# Patient Record
Sex: Male | Born: 1970 | Hispanic: Yes | Marital: Single | Smoking: Never smoker
Health system: Southern US, Community
[De-identification: ages and names within clinical notes are randomized; demographics above are authoritative.]

## PROBLEM LIST (undated history)

## (undated) DIAGNOSIS — N492 Inflammatory disorders of scrotum: Secondary | ICD-10-CM

## (undated) DIAGNOSIS — E119 Type 2 diabetes mellitus without complications: Secondary | ICD-10-CM

## (undated) DIAGNOSIS — I10 Essential (primary) hypertension: Secondary | ICD-10-CM

## (undated) HISTORY — PX: NO PAST SURGERIES: SHX2092

---

## 2018-01-27 ENCOUNTER — Emergency Department (HOSPITAL_COMMUNITY): Payer: 59

## 2018-01-27 ENCOUNTER — Inpatient Hospital Stay (HOSPITAL_COMMUNITY)
Admission: EM | Admit: 2018-01-27 | Discharge: 2018-01-30 | DRG: 872 | Disposition: A | Discharge status: Home Health | Payer: 59 | Attending: Family Medicine | Admitting: Family Medicine

## 2018-01-27 ENCOUNTER — Encounter (HOSPITAL_COMMUNITY): Payer: Self-pay | Admitting: Emergency Medicine

## 2018-01-27 DIAGNOSIS — A419 Sepsis, unspecified organism: Secondary | ICD-10-CM | POA: Diagnosis present

## 2018-01-27 DIAGNOSIS — N5089 Other specified disorders of the male genital organs: Secondary | ICD-10-CM | POA: Diagnosis not present

## 2018-01-27 DIAGNOSIS — R6520 Severe sepsis without septic shock: Secondary | ICD-10-CM | POA: Diagnosis present

## 2018-01-27 DIAGNOSIS — E119 Type 2 diabetes mellitus without complications: Secondary | ICD-10-CM

## 2018-01-27 DIAGNOSIS — I10 Essential (primary) hypertension: Secondary | ICD-10-CM | POA: Diagnosis present

## 2018-01-27 DIAGNOSIS — Z833 Family history of diabetes mellitus: Secondary | ICD-10-CM | POA: Diagnosis not present

## 2018-01-27 DIAGNOSIS — B372 Candidiasis of skin and nail: Secondary | ICD-10-CM | POA: Diagnosis present

## 2018-01-27 DIAGNOSIS — R52 Pain, unspecified: Secondary | ICD-10-CM

## 2018-01-27 DIAGNOSIS — R000 Tachycardia, unspecified: Secondary | ICD-10-CM | POA: Diagnosis not present

## 2018-01-27 DIAGNOSIS — N50811 Right testicular pain: Secondary | ICD-10-CM | POA: Diagnosis not present

## 2018-01-27 DIAGNOSIS — R81 Glycosuria: Secondary | ICD-10-CM | POA: Diagnosis not present

## 2018-01-27 DIAGNOSIS — N492 Inflammatory disorders of scrotum: Secondary | ICD-10-CM | POA: Diagnosis present

## 2018-01-27 DIAGNOSIS — Z8249 Family history of ischemic heart disease and other diseases of the circulatory system: Secondary | ICD-10-CM | POA: Diagnosis not present

## 2018-01-27 HISTORY — DX: Type 2 diabetes mellitus without complications: E11.9

## 2018-01-27 HISTORY — DX: Inflammatory disorders of scrotum: N49.2

## 2018-01-27 HISTORY — DX: Essential (primary) hypertension: I10

## 2018-01-27 LAB — BASIC METABOLIC PANEL
ANION GAP: 13 (ref 5–15)
Anion gap: 17 — ABNORMAL HIGH (ref 5–15)
BUN: 14 mg/dL (ref 6–20)
BUN: 13 mg/dL (ref 6–20)
CHLORIDE: 95 mmol/L — AB (ref 101–111)
CHLORIDE: 98 mmol/L — AB (ref 101–111)
CO2: 22 mmol/L (ref 22–32)
CO2: 23 mmol/L (ref 22–32)
CREATININE: 1.03 mg/dL (ref 0.61–1.24)
Calcium: 8.7 mg/dL — ABNORMAL LOW (ref 8.9–10.3)
Calcium: 7.9 mg/dL — ABNORMAL LOW (ref 8.9–10.3)
Creatinine, Ser: 1.06 mg/dL (ref 0.61–1.24)
GFR calc Af Amer: >60 mL/min (ref >60)
GFR calc Af Amer: >60 mL/min (ref >60)
GFR calc non Af Amer: >60 mL/min (ref >60)
Glucose, Bld: 463 mg/dL — ABNORMAL HIGH (ref 65–99)
Glucose, Bld: 361 mg/dL — ABNORMAL HIGH (ref 65–99)
POTASSIUM: 3.6 mmol/L (ref 3.5–5.1)
Potassium: 3.6 mmol/L (ref 3.5–5.1)
SODIUM: 134 mmol/L — AB (ref 135–145)
SODIUM: 134 mmol/L — AB (ref 135–145)

## 2018-01-27 LAB — CBC
HCT: 48.5 % (ref 39.0–52.0)
HEMOGLOBIN: 17.5 g/dL — AB (ref 13.0–17.0)
MCH: 31 pg (ref 26.0–34.0)
MCHC: 36.1 g/dL — AB (ref 30.0–36.0)
MCV: 86 fL (ref 78.0–100.0)
Platelets: 327 10*3/uL (ref 150–400)
RBC: 5.64 MIL/uL (ref 4.22–5.81)
RDW: 12.2 % (ref 11.5–15.5)
WBC: 17.3 10*3/uL — ABNORMAL HIGH (ref 4.0–10.5)

## 2018-01-27 LAB — HEMOGLOBIN A1C
HEMOGLOBIN A1C: 11 % — AB (ref 4.8–5.6)
Mean Plasma Glucose: 269 mg/dL

## 2018-01-27 LAB — I-STAT CG4 LACTIC ACID, ED
LACTIC ACID, VENOUS: 3.48 mmol/L — AB (ref 0.5–1.9)
Lactic Acid, Venous: 2.18 mmol/L (ref 0.5–1.9)

## 2018-01-27 LAB — MRSA PCR SCREENING: MRSA by PCR: NEGATIVE

## 2018-01-27 LAB — MAGNESIUM: Magnesium: 1.9 mg/dL (ref 1.7–2.4)

## 2018-01-27 LAB — LACTIC ACID, PLASMA: Lactic Acid, Venous: 1.9 mmol/L (ref 0.5–1.9)

## 2018-01-27 LAB — CBG MONITORING, ED: GLUCOSE-CAPILLARY: 346 mg/dL — AB (ref 65–99)

## 2018-01-27 LAB — PHOSPHORUS: Phosphorus: 4.2 mg/dL (ref 2.5–4.6)

## 2018-01-27 LAB — GLUCOSE, CAPILLARY
GLUCOSE-CAPILLARY: 370 mg/dL — AB (ref 65–99)
Glucose-Capillary: 321 mg/dL — ABNORMAL HIGH (ref 65–99)

## 2018-01-27 MED ORDER — SODIUM CHLORIDE 0.9 % IV SOLN
INTRAVENOUS | Status: DC
  Administered 2018-01-27 – 2018-01-30 (×5): via INTRAVENOUS

## 2018-01-27 MED ORDER — FENTANYL CITRATE (PF) 100 MCG/2ML IJ SOLN
50.0000 ug | Freq: Once | INTRAMUSCULAR | Status: AC
  Administered 2018-01-27: 50 ug via INTRAVENOUS
  Filled 2018-01-27: qty 2

## 2018-01-27 MED ORDER — ACETAMINOPHEN 325 MG PO TABS: 650.0000 mg | ORAL_TABLET | Freq: Four times a day (QID) | ORAL | Status: DC | PRN

## 2018-01-27 MED ORDER — LACTATED RINGERS IV BOLUS
1000.0000 mL | Freq: Once | INTRAVENOUS | Status: AC
  Administered 2018-01-27: 1000 mL via INTRAVENOUS

## 2018-01-27 MED ORDER — INSULIN ASPART 100 UNIT/ML ~~LOC~~ SOLN: 5.0000 [IU] | Freq: Once | SUBCUTANEOUS | Status: DC

## 2018-01-27 MED ORDER — LIDOCAINE HCL (PF) 1 % IJ SOLN
30.0000 mL | Freq: Once | INTRAMUSCULAR | Status: AC
  Administered 2018-01-27: 30 mL via INTRADERMAL
  Filled 2018-01-27: qty 30

## 2018-01-27 MED ORDER — PIPERACILLIN-TAZOBACTAM 3.375 G IVPB
3.3750 g | Freq: Three times a day (TID) | INTRAVENOUS | Status: DC
  Administered 2018-01-27 – 2018-01-30 (×9): 3.375 g via INTRAVENOUS
  Filled 2018-01-27 (×10): qty 50

## 2018-01-27 MED ORDER — OXYCODONE HCL 5 MG PO TABS: 5.0000 mg | ORAL_TABLET | ORAL | Status: DC | PRN

## 2018-01-27 MED ORDER — IOHEXOL 300 MG/ML  SOLN
75.0000 mL | Freq: Once | INTRAMUSCULAR | Status: AC
  Administered 2018-01-27: 75 mL via INTRAVENOUS

## 2018-01-27 MED ORDER — MORPHINE SULFATE (PF) 4 MG/ML IV SOLN: 1.0000 mg | INTRAVENOUS | Status: DC | PRN

## 2018-01-27 MED ORDER — VANCOMYCIN HCL 10 G IV SOLR
2000.0000 mg | Freq: Once | INTRAVENOUS | Status: AC
  Administered 2018-01-27: 2000 mg via INTRAVENOUS
  Filled 2018-01-27 (×2): qty 2000

## 2018-01-27 MED ORDER — ONDANSETRON HCL 4 MG/2ML IJ SOLN: 4.0000 mg | Freq: Four times a day (QID) | INTRAMUSCULAR | Status: DC | PRN

## 2018-01-27 MED ORDER — VANCOMYCIN HCL IN DEXTROSE 1-5 GM/200ML-% IV SOLN: 1000.0000 mg | Freq: Three times a day (TID) | INTRAVENOUS | Status: DC

## 2018-01-27 MED ORDER — INSULIN ASPART 100 UNIT/ML ~~LOC~~ SOLN
0.0000 [IU] | SUBCUTANEOUS | Status: DC
  Administered 2018-01-27 (×2): 11 [IU] via SUBCUTANEOUS
  Administered 2018-01-28 (×4): 8 [IU] via SUBCUTANEOUS
  Administered 2018-01-28: 11 [IU] via SUBCUTANEOUS
  Administered 2018-01-29: 5 [IU] via SUBCUTANEOUS
  Administered 2018-01-29: 3 [IU] via SUBCUTANEOUS
  Administered 2018-01-29: 5 [IU] via SUBCUTANEOUS
  Administered 2018-01-29 (×2): 3 [IU] via SUBCUTANEOUS
  Administered 2018-01-29: 5 [IU] via SUBCUTANEOUS
  Administered 2018-01-29: 3 [IU] via SUBCUTANEOUS
  Administered 2018-01-30: 5 [IU] via SUBCUTANEOUS
  Administered 2018-01-30 (×2): 2 [IU] via SUBCUTANEOUS
  Administered 2018-01-30: 8 [IU] via SUBCUTANEOUS
  Filled 2018-01-27: qty 1

## 2018-01-27 MED ORDER — VANCOMYCIN HCL IN DEXTROSE 1-5 GM/200ML-% IV SOLN
1000.0000 mg | Freq: Three times a day (TID) | INTRAVENOUS | Status: DC
  Administered 2018-01-27 – 2018-01-30 (×9): 1000 mg via INTRAVENOUS
  Filled 2018-01-27 (×10): qty 200

## 2018-01-27 MED ORDER — SODIUM CHLORIDE 0.9% FLUSH
3.0000 mL | Freq: Two times a day (BID) | INTRAVENOUS | Status: DC
  Administered 2018-01-27 – 2018-01-29 (×3): 3 mL via INTRAVENOUS

## 2018-01-27 MED ORDER — PIPERACILLIN-TAZOBACTAM 3.375 G IVPB 30 MIN
3.3750 g | Freq: Once | INTRAVENOUS | Status: AC
  Administered 2018-01-27: 3.375 g via INTRAVENOUS
  Filled 2018-01-27: qty 50

## 2018-01-27 MED ORDER — ONDANSETRON HCL 4 MG PO TABS: 4.0000 mg | ORAL_TABLET | Freq: Four times a day (QID) | ORAL | Status: DC | PRN

## 2018-01-27 MED ORDER — ACETAMINOPHEN 650 MG RE SUPP: 650.0000 mg | Freq: Four times a day (QID) | RECTAL | Status: DC | PRN

## 2018-01-27 MED ORDER — FLUCONAZOLE 100 MG PO TABS
200.0000 mg | ORAL_TABLET | Freq: Every day | ORAL | Status: DC
  Administered 2018-01-27 – 2018-01-30 (×4): 200 mg via ORAL
  Filled 2018-01-27 (×4): qty 2

## 2018-01-27 NOTE — ED Triage Notes (Signed)
Pt reports testicle swelling and pain for the past 2 days, denies any injury, no discharge, no difficulty urinating

## 2018-01-27 NOTE — ED Notes (Signed)
Admitting at bedside 

## 2018-01-27 NOTE — ED Notes (Signed)
Patient transported to CT 

## 2018-01-27 NOTE — ED Notes (Addendum)
CBG 346  

## 2018-01-27 NOTE — Progress Notes (Signed)
Received report from [place], Therapist, sports in Ed

## 2018-01-27 NOTE — Progress Notes (Signed)
Pharmacy Antibiotic Note  Erik Rojas is a 47 y.o. male admitted on 01/27/2018 with possible Fournier's gangrene.  Pharmacy has been consulted for vancomycin dosing. Zosyn has been ordered per EDP.  Plan: Zosyn 3.375 IV x1 over 30 min then q8h EI per MD Vancomycin 2000mg  IV x1 then 1000mg  IV q8h Monitor surgical plans, LOT, cultures Vancomycin level as needed     Temp (24hrs), Avg:99.6 F (37.6 C), Min:99.6 F (37.6 C), Max:99.6 F (37.6 C)  Recent Labs  Lab 01/27/18 1029  WBC 17.3*  CREATININE 1.03    CrCl cannot be calculated (Unknown ideal weight.).    No Known Allergies  Antimicrobials this admission: 6/13 Vancomycin >>  6/13 Zosyn >>   Dose adjustments this admission: none  Microbiology results: ordered  Thank you for allowing pharmacy to be a part of this patient's care.  Arrie Senate, PharmD, BCPS PGY-2 Cardiology Pharmacy Resident Phone: [phone] 01/27/2018

## 2018-01-27 NOTE — ED Notes (Signed)
Urology arrives at bedside

## 2018-01-27 NOTE — ED Provider Notes (Signed)
[facility] EMERGENCY DEPARTMENT Provider Note   CSN: [id number] Arrival date & time: 01/27/18  1001     History   Chief Complaint Chief Complaint  Patient presents with  . Testicle Pain    HPI Kadien Lineman is a 47 y.o. male.  The history is provided by the patient. No language interpreter was used.  Testicle Pain    Dondrell Loudermilk is a 47 y.o. male who presents to the Emergency Department complaining of testicle pain. Two days of right testicular swelling  followed by pain this morning. He denies any fevers, nausea, vomiting, difficulty urinating. He has a history of hypertension, no additional medical problems. He does have a family history of diabetes. He is a non-smoker, no alcohol or drug use. He is not currently sexually active. Symptoms are severe, constant, worsening. History reviewed. No pertinent past medical history.  There are no active problems to display for this patient.   History reviewed. No pertinent surgical history.      Home Medications    Prior to Admission medications   Not on File    Family History No family history on file.  Social History Social History   Tobacco Use  . Smoking status: Not on file  Substance Use Topics  . Alcohol use: Not on file  . Drug use: Not on file     Allergies   Patient has no known allergies.   Review of Systems Review of Systems  Genitourinary: Positive for testicular pain.  All other systems reviewed and are negative.    Physical Exam Updated Vital Signs BP (!) 141/88   Pulse (!) 114   Temp 99.6 F (37.6 C) (Oral)   Resp (!) 25   Wt 99.8 kg (220 lb)   SpO2 93%   Physical Exam  Constitutional: He is oriented to person, place, and time. He appears well-developed and well-nourished.  HENT:  Head: Normocephalic and atraumatic.  Cardiovascular: Regular rhythm.  No murmur heard. tachycardic  Pulmonary/Chest: Effort normal and breath sounds normal.  No respiratory distress.  Abdominal: Soft. There is no tenderness. There is no rebound and no guarding.  Genitourinary:  Genitourinary Comments: Severe scrotal erythema and edema bilaterally, unable to palpate testicles, right greater than left. There is a central area of ecchymosis, approx 2-3 cm in diameter.  Minimal local tenderness.  Musculoskeletal: He exhibits no edema or tenderness.  Neurological: He is alert and oriented to person, place, and time.  Skin: Skin is warm and dry.  Psychiatric: He has a normal mood and affect. His behavior is normal.  Nursing note and vitals reviewed.    ED Treatments / Results  Labs (all labs ordered are listed, but only abnormal results are displayed) Labs Reviewed  CBC - Abnormal; Notable for the following components:      Result Value   WBC 17.3 (*)    Hemoglobin 17.5 (*)    MCHC 36.1 (*)    All other components within normal limits  BASIC METABOLIC PANEL - Abnormal; Notable for the following components:   Sodium 134 (*)    Chloride 95 (*)    Glucose, Bld 463 (*)    Calcium 8.7 (*)    Anion gap 17 (*)    All other components within normal limits  I-STAT CG4 LACTIC ACID, ED - Abnormal; Notable for the following components:   Lactic Acid, Venous 3.48 (*)    All other components within normal limits  I-STAT CG4 LACTIC ACID, ED - Abnormal;  Notable for the following components:   Lactic Acid, Venous 2.18 (*)    All other components within normal limits  URINE CULTURE  CULTURE, BLOOD (ROUTINE X 2)  CULTURE, BLOOD (ROUTINE X 2)  URINALYSIS, ROUTINE W REFLEX MICROSCOPIC    EKG None  Radiology Ct Pelvis W Contrast  Result Date: 01/27/2018 CLINICAL DATA:  Testicular swelling and pain for 2 days. EXAM: CT PELVIS WITH CONTRAST TECHNIQUE: Multidetector CT imaging of the pelvis was performed using the standard protocol following the bolus administration of intravenous contrast. CONTRAST:  71mL OMNIPAQUE IOHEXOL 300 MG/ML  SOLN COMPARISON:   None. FINDINGS: Urinary Tract:  No abnormality visualized. Bowel:  Unremarkable visualized pelvic bowel loops. Vascular/Lymphatic: No pathologically enlarged lymph nodes. No significant vascular abnormality seen. Reproductive: Prostate gland is unremarkable. Extensive soft tissue emphysema involving the right scrotum, surrounding the testicle and extending into the spermatic cord, most concerning for Fournier's gangrene. Other:  None. Musculoskeletal: No acute osseous abnormality. No aggressive osseous lesion. IMPRESSION: 1. Extensive soft tissue emphysema involving the right scrotum, surrounding the testicle and extending into the spermatic cord, most concerning for Fournier's gangrene. Critical Value/emergent results were called by telephone at the time of interpretation on 01/27/2018 at 1:07 pm to [facility] , who verbally acknowledged these results. Electronically Signed   By: Kathreen Devoid   On: 01/27/2018 13:07   US Scrotum W/doppler  Result Date: 01/27/2018 CLINICAL DATA:  Right greater than left scrotal swelling, pain EXAM: SCROTAL ULTRASOUND DOPPLER ULTRASOUND OF THE TESTICLES TECHNIQUE: Complete ultrasound examination of the testicles, epididymis, and other scrotal structures was performed. Color and spectral Doppler ultrasound were also utilized to evaluate blood flow to the testicles. COMPARISON:  None. FINDINGS: Right testicle Measurements: 4.5 x 2.3 x 3.6 cm. No mass or microlithiasis visualized. Left testicle Measurements: 4.9 x 1.9 x 3.2 cm. No mass or microlithiasis visualized. Right epididymis:  5 mm epididymal head cyst. Left epididymis:  Normal in size and appearance. Hydrocele:  None visualized. Varicocele:  None visualized. Pulsed Doppler interrogation of both testes demonstrates normal low resistance arterial and venous waveforms bilaterally. Other: Extensive echogenic foci are noted throughout the right scrotum with posterior acoustic shadowing. This is concerning for possible scrotal  soft tissue gas. Cannot exclude Fournier's gangrene. Recommend further evaluation with he pelvic CT. IMPRESSION: Echogenic foci with shadowing noted throughout the right scrotal soft tissues concerning for gas. Cannot exclude Fournier's gangrene. Recommend further evaluation with pelvic CT. No testicular abnormality. These results were called by telephone at the time of interpretation on 01/27/2018 at 11:27 am to [facility] , who verbally acknowledged these results. Electronically Signed   By: Rolm Baptise M.D.   On: 01/27/2018 11:27    Procedures Procedures (including critical care time) CRITICAL CARE Performed by: Quintella Reichert   Total critical care time: 45 minutes  Critical care time was exclusive of separately billable procedures and treating other patients.  Critical care was necessary to treat or prevent imminent or life-threatening deterioration.  Critical care was time spent personally by me on the following activities: development of treatment plan with patient and/or surrogate as well as nursing, discussions with consultants, evaluation of patient's response to treatment, examination of patient, obtaining history from patient or surrogate, ordering and performing treatments and interventions, ordering and review of laboratory studies, ordering and review of radiographic studies, pulse oximetry and re-evaluation of patient's condition.  Medications Ordered in ED Medications  vancomycin (VANCOCIN) 2,000 mg in sodium chloride 0.9 % 500 mL IVPB (2,000 mg  Intravenous New Bag/Given 01/27/18 1536)  piperacillin-tazobactam (ZOSYN) IVPB 3.375 g (has no administration in time range)  vancomycin (VANCOCIN) IVPB 1000 mg/200 mL premix (has no administration in time range)  fentaNYL (SUBLIMAZE) injection 50 mcg (has no administration in time range)  lactated ringers bolus 1,000 mL (0 mLs Intravenous Stopped 01/27/18 1316)  piperacillin-tazobactam (ZOSYN) IVPB 3.375 g (0 g Intravenous Stopped  01/27/18 1252)  iohexol (OMNIPAQUE) 300 MG/ML solution 75 mL (75 mLs Intravenous Contrast Given 01/27/18 1229)  lactated ringers bolus 1,000 mL (0 mLs Intravenous Stopped 01/27/18 1432)  lidocaine (PF) (XYLOCAINE) 1 % injection 30 mL (30 mLs Intradermal Given 01/27/18 1536)     Initial Impression / Assessment and Plan / ED Course  I have reviewed the triage vital signs and the nursing notes.  Pertinent labs & imaging results that were available during my care of the patient were reviewed by me and considered in my medical decision making (see chart for details).     Here for evaluation of groin swelling and pain. He does have significant swelling on examination with mild local tenderness. Scrotal ultrasound with gas concerning for possible necrotizing soft tissue infection. Discussed the case with Urology, Dr. Alyson Ingles. CT pelvis obtained to further evaluate. CT also demonstrates subcutaneous emphysema. Urologist evaluated the patient at bedside and performed I and D.  Medicine consulted for admission for severe sepsis, antibiotics.    Final Clinical Impressions(s) / ED Diagnoses   Final diagnoses:  Scrotal abscess  Severe sepsis [facility])    ED Discharge Orders    None       Quintella Reichert, MD 01/27/18 1614

## 2018-01-27 NOTE — ED Notes (Signed)
Date and time results received: 01/27/18 1238 (use smartphrase ".now" to insert current time)  Test: Lactic acid Critical Value: 3.48  Name of Provider Notified: Dr. Ralene Bathe  Orders Received? Or Actions Taken?:EDP to place new orders

## 2018-01-27 NOTE — ED Notes (Signed)
Got error with Recruitment consultant.  Will use patient labels for blood drawn

## 2018-01-27 NOTE — ED Notes (Signed)
Pt made aware of need for urine sample.  States does not want to try at this time due to recent procedure on his testicle.

## 2018-01-27 NOTE — Progress Notes (Signed)
Patient CBG 370 at 2023 .  Patient was given 11 units in the ED an hour ago for CBG of 346. Patient has orders to check CBG Q4H.  Notified NP K. Schorr on call. Will continue to monitor the patient.

## 2018-01-27 NOTE — ED Notes (Signed)
PT last had something to drink at noon.   Last ate yesterday at 1900.

## 2018-01-27 NOTE — Consult Note (Signed)
Urology Consult  Referring physician: Dr. Ralene Bathe Reason for referral: scrotal abscess  Chief Complaint: scrotal pain  History of Present Illness: Mr Erik Rojas is a 47yo with a hx of hypertension and new diagnosis of DMII who presented to [facility] ER with a 3 day history of progressive scrotal swelling and a 2 day history of scrotal pain. No previous history of abscesses. The pain is sharp, constant, severe, and nonradiating. It is worse in his right hemiscrotum. No exacerbating/alleviaitng events. No other associated symptoms. CT shows a scrotal abscess with gas but no concern for fourniers gangrene  Past Medical History:  Diagnosis Date  . HTN (hypertension)    History reviewed. No pertinent surgical history.  Medications: I have reviewed the patient's current medications. Allergies: No Known Allergies  Family History  Problem Relation Age of Onset  . Diabetes Mellitus II Mother   . Hypertension Mother   . Diabetes Mellitus II Brother    Social History:  has no tobacco, alcohol, and drug history on file.  Review of Systems  Constitutional: Positive for chills.  All other systems reviewed and are negative.   Physical Exam:  Vital signs in last 24 hours: Temp:  [99.6 F (37.6 C)] 99.6 F (37.6 C) (06/13 1013) Pulse Rate:  [111-140] 113 (06/13 1700) Resp:  [10-29] 27 (06/13 1700) BP: (128-151)/(82-106) 128/82 (06/13 1700) SpO2:  [92 %-98 %] 93 % (06/13 1700) Weight:  [99.8 kg (220 lb)] 99.8 kg (220 lb) (06/13 1200) Physical Exam  Constitutional: He is oriented to person, place, and time. He appears well-developed and well-nourished.  HENT:  Head: Normocephalic and atraumatic.  Eyes: Pupils are equal, round, and reactive to light. EOM are normal.  Neck: Normal range of motion. No thyromegaly present.  Cardiovascular: Normal rate and regular rhythm.  Respiratory: Effort normal. No respiratory distress.  GI: Soft. He exhibits no distension. There is no tenderness. Hernia  confirmed negative in the right inguinal area and confirmed negative in the left inguinal area.  Genitourinary: Penis normal. Right testis shows swelling. Left testis shows swelling. Uncircumcised.  Genitourinary Comments: right lower scrotal wall edema and erythema  Musculoskeletal: Normal range of motion. He exhibits no edema.  Lymphadenopathy:       Right: No inguinal adenopathy present.       Left: No inguinal adenopathy present.  Neurological: He is alert and oriented to person, place, and time.  Skin: Skin is warm and dry.  Psychiatric: He has a normal mood and affect. His behavior is normal. Judgment and thought content normal.    Laboratory Data:  Results for orders placed or performed during the hospital encounter of 01/27/18 (from the past 72 hour(s))  CBC     Status: Abnormal   Collection Time: 01/27/18 10:29 AM  Result Value Ref Range   WBC 17.3 (H) 4.0 - 10.5 K/uL   RBC 5.64 4.22 - 5.81 MIL/uL   Hemoglobin 17.5 (H) 13.0 - 17.0 g/dL   HCT 48.5 39.0 - 52.0 %   MCV 86.0 78.0 - 100.0 fL   MCH 31.0 26.0 - 34.0 pg   MCHC 36.1 (H) 30.0 - 36.0 g/dL   RDW 12.2 11.5 - 15.5 %   Platelets 327 150 - 400 K/uL    Comment: Performed at [facility] [address] [place], [place] [address]  Basic metabolic panel     Status: Abnormal   Collection Time: 01/27/18 10:29 AM  Result Value Ref Range   Sodium 134 (L) 135 -  145 mmol/L   Potassium 3.6 3.5 - 5.1 mmol/L   Chloride 95 (L) 101 - 111 mmol/L   CO2 22 22 - 32 mmol/L   Glucose, Bld 463 (H) 65 - 99 mg/dL   BUN 14 6 - 20 mg/dL   Creatinine, Ser 1.03 0.61 - 1.24 mg/dL   Calcium 8.7 (L) 8.9 - 10.3 mg/dL   GFR calc non Af Amer >60 >60 mL/min   GFR calc Af Amer >60 >60 mL/min    Comment: (NOTE) The eGFR has been calculated using the CKD EPI equation. This calculation has not been validated in all clinical situations. eGFR's persistently <60 mL/min signify possible Chronic Kidney Disease.    Anion gap 17 (H) 5 - 15     Comment: Performed at [facility], [place] [address], [place], [place] [address]  I-Stat CG4 Lactic Acid, ED  (not at  [facility])     Status: Abnormal   Collection Time: 01/27/18 12:33 PM  Result Value Ref Range   Lactic Acid, Venous 3.48 (HH) 0.5 - 1.9 mmol/L   Comment NOTIFIED PHYSICIAN   I-Stat CG4 Lactic Acid, ED  (not at  [facility])     Status: Abnormal   Collection Time: 01/27/18  2:55 PM  Result Value Ref Range   Lactic Acid, Venous 2.18 (HH) 0.5 - 1.9 mmol/L   Comment NOTIFIED PHYSICIAN    No results found for this or any previous visit (from the past 240 hour(s)). Creatinine: Recent Labs    01/27/18 1029  CREATININE 1.03   Baseline Creatinine: 1  Preprocedure diagnosis: scrotal abscess  Postprocedure diagnosis: Same  Procedure: 1. Incision and drainage of scrotal abscess  Attending: Nicolette Bang, MD  Anesthesia: local  History of blood loss: Minimal  Drains: none  Findings: large right hemiscrotum abscess  Indications: Patient is a 47 year old male with a history of right scrotal abscess that was growing in size and causing him pain with walking.  We discussed the treatment options including observation versus excision after discussing treatment options he proceed with drainage.   Procedure in detail: Prior to procedure verbal consent was obtained.  His genitalia was then prepped and draped in usual sterile fashion.  A 3 cm incision was made in the right hemiscrotum.  We then proceeded to drain  The abscess and multiple pockets of abscess were opened. We then packed the right hemiscrotum with iodiform gauze. The patient tolerated the procedure well.  Complications: None  Condition: Stable  Impression/Assessment:  46yo with a scrotal abscess  Plan:  1. Scrotal abscess drained at the bedside and packed with iodiform gauze. Please continue daily packing changes. Please treat with 7 days of either bactrim or clindamycin. The [facility] 7-10 days after discharge for a wound check.  Nicolette Bang 01/27/2018, 5:43 PM

## 2018-01-27 NOTE — H&P (Signed)
History and Physical    Erik Rojas RWE:[id number] DOB: 07/27/1971 DOA: 01/27/2018  **Will admit patient based on the expectation that the patient will need hospitalization/ hospital care that crosses at least 2 midnights  PCP: Patient, No Pcp Per   Attending physician: Evangeline Gula  Patient coming from/Resides with: Private residence  Chief Complaint: Scrotal pain and swelling  HPI: Erik Rojas is a 47 y.o. male with medical history significant for untreated hypertension.  Patient reports 2 days of right testicular swelling followed by pain.  Denied knowledge of recent trauma or known colitis.  Does not shave this region of his body.  Has never had testicular swelling before.  He is not sexually active.  She had a low-grade fever 99.6.  Initial heart rate was 140 bpm and BP 151/106.  He was not hypoxemic.  Count was 17,300 differential not obtained.  Initial lactic acid 3.48 with decreased down to 2.18 midway through volume resuscitation.  Blood cultures were obtained in ER.  Scrotal ultrasound demonstrated echogenic foci shadowing throughout the right scrotal soft tissues concerning for gas.  Pelvic CT was obtained and revealed extensive soft tissue emphysema involving the right scrotum surrounding the testicle extending into the spermatic cord concerning for Fournier's gangrene.  Because of these findings EDP notified Urology/Dr. Alyson Ingles came to the bedside, examined the patient and reviewed the CT.  Urology felt current clinical exam more consistent with abscess and bedside I&D was done.  Patient has been given empiric Zosyn and vancomycin IV.  In addition to the above findings regarding the scrotal infection patient was found to have a glucose of 463 with a mildly elevated anion gap of 17 consistent with likely new onset diabetes mellitus.  ED Course:  Vital Signs: BP (!) 143/90   Pulse (!) 113   Temp 99.6 F (37.6 C) (Oral)   Resp 16   Wt 99.8 kg (220 lb)   SpO2  94%  Scrotal ultrasound/CT pelvis: As above Lab data: Sodium 134, potassium 3.6, chloride 95, CO2 22, glucose 463, BUN 14, creatinine 1.03, calcium 8.7, anion gap 17, lactic acid 3.48 and follow-up 2.18, white count 17,300 differential not obtained, hemoglobin 17.5, platelets 327,000, blood cultures obtained in the ER; urinalysis and urine culture pending collection Medications and treatments: LR bolus x2 L, Zosyn 3.375 g IV x1, vancomycin 2 g IV x1, fentanyl 50 mcg IV x1  Review of Systems:  In addition to the HPI above,  No Fever-chills, myalgias or other constitutional symptoms No Headache, changes with Vision or hearing, new weakness, tingling, numbness in any extremity, dizziness, dysarthria or word finding difficulty, gait disturbance or imbalance, tremors or seizure activity No problems swallowing food or Liquids, indigestion/reflux, choking or coughing while eating, abdominal pain with or after eating No Chest pain, Cough or Shortness of Breath, palpitations, orthopnea or DOE No Abdominal pain, N/V, melena,hematochezia, dark tarry stools, constipation No dysuria, malodorous urine, hematuria or flank pain No new skin rashes, lesions, masses or bruises,-right scrotal swelling as described above No new joint pains, aches, swelling or redness No recent unintentional weight gain or loss No polyuria, polydypsia or polyphagia   Past Medical History:  Diagnosis Date  . HTN (hypertension)     History reviewed. No pertinent surgical history.  Social History   Socioeconomic History  . Marital status: Single    Spouse name: Not on file  . Number of children: Not on file  . Years of education: Not on file  . Highest education level: Not on  file  Occupational History  . Not on file  Social Needs  . Financial resource strain: Not on file  . Food insecurity:    Worry: Not on file    Inability: Not on file  . Transportation needs:    Medical: Not on file    Non-medical: Not on file    Tobacco Use  . Smoking status: Not on file  Substance and Sexual Activity  . Alcohol use: Not on file  . Drug use: Not on file  . Sexual activity: Not on file  Lifestyle  . Physical activity:    Days per week: Not on file    Minutes per session: Not on file  . Stress: Not on file  Relationships  . Social connections:    Talks on phone: Not on file    Gets together: Not on file    Attends religious service: Not on file    Active member of club or organization: Not on file    Attends meetings of clubs or organizations: Not on file    Relationship status: Not on file  . Intimate partner violence:    Fear of current or ex partner: Not on file    Emotionally abused: Not on file    Physically abused: Not on file    Forced sexual activity: Not on file  Other Topics Concern  . Not on file  Social History Narrative  . 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 Diabetes Mellitus II Mother   . Hypertension Mother   . Diabetes Mellitus II Brother      Prior to Admission medications   Not on File    Physical Exam: Vitals:   01/27/18 1330 01/27/18 1445 01/27/18 1530 01/27/18 1545  BP: (!) 134/92 (!) 130/95 (!) 141/88 (!) 143/90  Pulse: (!) 117 (!) 111 (!) 114 (!) 113  Resp: (!) 21 (!) 24 (!) 25 16  Temp:      TempSrc:      SpO2: 98% 93% 93% 94%  Weight:          Constitutional: NAD, calm, comfortable Eyes: PERRL, lids and conjunctivae normal ENMT: Mucous membranes are dry. Posterior pharynx clear of any exudate or lesions.Normal dentition.  Neck: normal, supple, no masses, no thyromegaly Respiratory: clear to auscultation bilaterally, no wheezing, no crackles. Normal respiratory effort. No accessory muscle use.  Cardiovascular: Regular rate and  rhythm, no murmurs / rubs / gallops. No extremity edema. 2+ pedal pulses. No carotid bruits.  Abdomen: no tenderness, no masses palpated. No hepatosplenomegaly. Bowel sounds positive.  Genitourinary: Has marked scrotal edema greater on the right with erythematous change.  Has 1.5 cm linear incision status post bedside I&D by urology that is oozing frank red blood.  Constrict in place.  Medial thighs around scrotum with changes consistent with Candida and odor detected consistent with yeast. Musculoskeletal: no clubbing / cyanosis. No joint deformity upper and lower extremities. Good ROM, no contractures. Normal muscle tone.  Skin: no rashes, lesions, ulcers. No induration Neurologic: CN 2-12 grossly intact. Sensation intact, DTR normal. Strength 5/5 x all 4 extremities.  Psychiatric: Normal judgment and insight. Alert and oriented x 3. Normal mood.    Labs on Admission: I have personally reviewed following labs and imaging  studies  CBC: Recent Labs  Lab 01/27/18 1029  WBC 17.3*  HGB 17.5*  HCT 48.5  MCV 86.0  PLT 701   Basic Metabolic Panel: Recent Labs  Lab 01/27/18 1029  NA 134*  K 3.6  CL 95*  CO2 22  GLUCOSE 463*  BUN 14  CREATININE 1.03  CALCIUM 8.7*   GFR: CrCl cannot be calculated (Unknown ideal weight.). Liver Function Tests: No results for input(s): AST, ALT, ALKPHOS, BILITOT, PROT, ALBUMIN in the last 168 hours. No results for input(s): LIPASE, AMYLASE in the last 168 hours. No results for input(s): AMMONIA in the last 168 hours. Coagulation Profile: No results for input(s): INR, PROTIME in the last 168 hours. Cardiac Enzymes: No results for input(s): CKTOTAL, CKMB, CKMBINDEX, TROPONINI in the last 168 hours. BNP (last 3 results) No results for input(s): PROBNP in the last 8760 hours. HbA1C: No results for input(s): HGBA1C in the last 72 hours. CBG: No results for input(s): GLUCAP in the last 168 hours. Lipid Profile: No results for input(s): CHOL, HDL,  LDLCALC, TRIG, CHOLHDL, LDLDIRECT in the last 72 hours. Thyroid Function Tests: No results for input(s): TSH, T4TOTAL, FREET4, T3FREE, THYROIDAB in the last 72 hours. Anemia Panel: No results for input(s): VITAMINB12, FOLATE, FERRITIN, TIBC, IRON, RETICCTPCT in the last 72 hours. Urine analysis: No results found for: COLORURINE, APPEARANCEUR, LABSPEC, PHURINE, GLUCOSEU, HGBUR, BILIRUBINUR, KETONESUR, PROTEINUR, UROBILINOGEN, NITRITE, LEUKOCYTESUR Sepsis Labs: @LABRCNTIP (procalcitonin:4,lacticidven:4) )No results found for this or any previous visit (from the past 240 hour(s)).   Radiological Exams on Admission: Ct Pelvis W Contrast  Result Date: 01/27/2018 CLINICAL DATA:  Testicular swelling and pain for 2 days. EXAM: CT PELVIS WITH CONTRAST TECHNIQUE: Multidetector CT imaging of the pelvis was performed using the standard protocol following the bolus administration of intravenous contrast. CONTRAST:  61mL OMNIPAQUE IOHEXOL 300 MG/ML  SOLN COMPARISON:  None. FINDINGS: Urinary Tract:  No abnormality visualized. Bowel:  Unremarkable visualized pelvic bowel loops. Vascular/Lymphatic: No pathologically enlarged lymph nodes. No significant vascular abnormality seen. Reproductive: Prostate gland is unremarkable. Extensive soft tissue emphysema involving the right scrotum, surrounding the testicle and extending into the spermatic cord, most concerning for Fournier's gangrene. Other:  None. Musculoskeletal: No acute osseous abnormality. No aggressive osseous lesion. IMPRESSION: 1. Extensive soft tissue emphysema involving the right scrotum, surrounding the testicle and extending into the spermatic cord, most concerning for Fournier's gangrene. Critical Value/emergent results were called by telephone at the time of interpretation on 01/27/2018 at 1:07 pm to [facility] , who verbally acknowledged these results. Electronically Signed   By: Kathreen Devoid   On: 01/27/2018 13:07   US Scrotum W/doppler  Result  Date: 01/27/2018 CLINICAL DATA:  Right greater than left scrotal swelling, pain EXAM: SCROTAL ULTRASOUND DOPPLER ULTRASOUND OF THE TESTICLES TECHNIQUE: Complete ultrasound examination of the testicles, epididymis, and other scrotal structures was performed. Color and spectral Doppler ultrasound were also utilized to evaluate blood flow to the testicles. COMPARISON:  None. FINDINGS: Right testicle Measurements: 4.5 x 2.3 x 3.6 cm. No mass or microlithiasis visualized. Left testicle Measurements: 4.9 x 1.9 x 3.2 cm. No mass or microlithiasis visualized. Right epididymis:  5 mm epididymal head cyst. Left epididymis:  Normal in size and appearance. Hydrocele:  None visualized. Varicocele:  None visualized. Pulsed Doppler interrogation of both testes demonstrates normal low resistance arterial and venous waveforms bilaterally. Other: Extensive echogenic foci are noted throughout the right scrotum with posterior acoustic shadowing. This is concerning for possible scrotal soft tissue gas. Cannot exclude  Fournier's gangrene. Recommend further evaluation with he pelvic CT. IMPRESSION: Echogenic foci with shadowing noted throughout the right scrotal soft tissues concerning for gas. Cannot exclude Fournier's gangrene. Recommend further evaluation with pelvic CT. No testicular abnormality. These results were called by telephone at the time of interpretation on 01/27/2018 at 11:27 am to [facility] , who verbally acknowledged these results. Electronically Signed   By: Rolm Baptise M.D.   On: 01/27/2018 11:27    EKG: (Independently reviewed) sinus tachycardia with ventricular rate 126 bpm, QTC 465 ms, normal R wave rotation, no acute ischemic changes, elevated J-point in the inferior leads consistent with early repolarization  Assessment/Plan Principal Problem:   Sepsis 2/2 Scrotal abscess/scrotal and medial thigh Candida dermatitis -Presents with 2 days of right scrotal swelling with 24 hours of pain and upon  presentation to ER and was found to be mildly febrile with leukocytosis and diagnostic imaging concerning for subcutaneous emphysema/Fournier's green.  He has been evaluated by urology who felt patient had underlying scrotal abscess and perform bedside I&D -Sepsis physiology as follows: Tachycardia, white count greater than 12,000, source of infection, elevated serum lactate which is currently trending downward -Continue gentle volume resuscitation noting patient is normotensive and borderline hypertensive -Repeat serum lactate until normalized -Follow-up on blood cultures -Obtain urinalysis and urine culture -Broad-spectrum empiric antibiotics: IV Zosyn/vancomycin and narrow as appropriate -Oral Diflucan for Candida -Suspect current infectious process precipitated by smoldering, undetected diabetes -CM consultation-even location of wound patient may require assistance initially with wound care after discharge; he also may benefit from [facility] RN follow-up after discharge given new onset diabetes  Active Problems:   New onset type 2 diabetes mellitus  -With moderate hyperglycemia with serum glucose greater than 450 with mildly elevated AG 17 -Anticipate AG will normalize with hydration -Positive family history of diabetes -Follow CBGs every 4 hours and provide SSI -HgbA1c-if markedly elevated likely will require initiation of long-acting insulin otherwise can probably initiate metformin -Diabetes educator consultation    HTN (hypertension) -Has borderline hypertension but given underlying new diagnosis of diabetes goal should likely be 120/80 -If agent initiated suggest ACE I for nephro protection    **Additional lab, imaging and/or diagnostic evaluation at discretion of supervising physician  DVT prophylaxis: SCDs Code Status: Full Family Communication: No family at bedside Disposition Plan: Home Consults called: None    ELLIS,ALLISON L. ANP-BC [facility] Pager  [phone]   If 7PM-7AM, please contact night-coverage [email or web address] Password [email or web address]  01/27/2018, 4:37 PM

## 2018-01-28 ENCOUNTER — Other Ambulatory Visit: Payer: Self-pay

## 2018-01-28 ENCOUNTER — Encounter (HOSPITAL_COMMUNITY): Payer: Self-pay | Admitting: General Practice

## 2018-01-28 DIAGNOSIS — N492 Inflammatory disorders of scrotum: Secondary | ICD-10-CM

## 2018-01-28 DIAGNOSIS — E119 Type 2 diabetes mellitus without complications: Secondary | ICD-10-CM

## 2018-01-28 HISTORY — DX: Inflammatory disorders of scrotum: N49.2

## 2018-01-28 LAB — CBC
HCT: 38.3 % — ABNORMAL LOW (ref 39.0–52.0)
Hemoglobin: 13.7 g/dL (ref 13.0–17.0)
MCH: 30.9 pg (ref 26.0–34.0)
MCHC: 35.8 g/dL (ref 30.0–36.0)
MCV: 86.5 fL (ref 78.0–100.0)
Platelets: 313 10*3/uL (ref 150–400)
RBC: 4.43 MIL/uL (ref 4.22–5.81)
RDW: 12.6 % (ref 11.5–15.5)
WBC: 14.8 10*3/uL — ABNORMAL HIGH (ref 4.0–10.5)

## 2018-01-28 LAB — COMPREHENSIVE METABOLIC PANEL
ALBUMIN: 2.2 g/dL — AB (ref 3.5–5.0)
ALT: 16 U/L — ABNORMAL LOW (ref 17–63)
AST: 17 U/L (ref 15–41)
Alkaline Phosphatase: 93 U/L (ref 38–126)
Anion gap: 8 (ref 5–15)
BILIRUBIN TOTAL: 1.1 mg/dL (ref 0.3–1.2)
BUN: 11 mg/dL (ref 6–20)
CO2: 27 mmol/L (ref 22–32)
Calcium: 7.7 mg/dL — ABNORMAL LOW (ref 8.9–10.3)
Chloride: 99 mmol/L — ABNORMAL LOW (ref 101–111)
Creatinine, Ser: 0.92 mg/dL (ref 0.61–1.24)
GFR calc Af Amer: >60 mL/min (ref >60)
GFR calc non Af Amer: >60 mL/min (ref >60)
GLUCOSE: 295 mg/dL — AB (ref 65–99)
Potassium: 3.3 mmol/L — ABNORMAL LOW (ref 3.5–5.1)
Sodium: 134 mmol/L — ABNORMAL LOW (ref 135–145)
TOTAL PROTEIN: 5.8 g/dL — AB (ref 6.5–8.1)

## 2018-01-28 LAB — URINALYSIS, ROUTINE W REFLEX MICROSCOPIC
BACTERIA UA: NONE SEEN
Bilirubin Urine: NEGATIVE
Hgb urine dipstick: NEGATIVE
Ketones, ur: 5 mg/dL — AB
Leukocytes, UA: NEGATIVE
NITRITE: NEGATIVE
PH: 5 (ref 5.0–8.0)
Protein, ur: NEGATIVE mg/dL
SPECIFIC GRAVITY, URINE: 1.036 — AB (ref 1.005–1.030)

## 2018-01-28 LAB — GLUCOSE, CAPILLARY
GLUCOSE-CAPILLARY: 255 mg/dL — AB (ref 65–99)
GLUCOSE-CAPILLARY: 227 mg/dL — AB (ref 65–99)
Glucose-Capillary: 275 mg/dL — ABNORMAL HIGH (ref 65–99)
Glucose-Capillary: 342 mg/dL — ABNORMAL HIGH (ref 65–99)
Glucose-Capillary: 255 mg/dL — ABNORMAL HIGH (ref 65–99)

## 2018-01-28 LAB — HIV ANTIBODY (ROUTINE TESTING W REFLEX): HIV Screen 4th Generation wRfx: NONREACTIVE

## 2018-01-28 MED ORDER — INSULIN GLARGINE 100 UNIT/ML ~~LOC~~ SOLN
10.0000 [IU] | Freq: Two times a day (BID) | SUBCUTANEOUS | Status: DC
  Administered 2018-01-28 – 2018-01-29 (×2): 10 [IU] via SUBCUTANEOUS
  Filled 2018-01-28 (×4): qty 0.1

## 2018-01-28 MED ORDER — LIVING WELL WITH DIABETES BOOK
Freq: Once | Status: AC
  Administered 2018-01-28: 13:00:00
  Filled 2018-01-28: qty 1

## 2018-01-28 MED ORDER — HYDRALAZINE HCL 20 MG/ML IJ SOLN: 10.0000 mg | Freq: Four times a day (QID) | INTRAMUSCULAR | Status: DC | PRN

## 2018-01-28 MED ORDER — INSULIN GLARGINE 100 UNIT/ML ~~LOC~~ SOLN
10.0000 [IU] | Freq: Every day | SUBCUTANEOUS | Status: DC
  Administered 2018-01-28: 10 [IU] via SUBCUTANEOUS
  Filled 2018-01-28: qty 0.1

## 2018-01-28 MED ORDER — METFORMIN HCL 500 MG PO TABS
500.0000 mg | ORAL_TABLET | Freq: Two times a day (BID) | ORAL | Status: DC
  Administered 2018-01-28 – 2018-01-29 (×3): 500 mg via ORAL
  Filled 2018-01-28 (×3): qty 1

## 2018-01-28 MED ORDER — AMLODIPINE BESYLATE 2.5 MG PO TABS
2.5000 mg | ORAL_TABLET | Freq: Every day | ORAL | Status: DC
  Administered 2018-01-28 – 2018-01-29 (×2): 2.5 mg via ORAL
  Filled 2018-01-28 (×2): qty 1

## 2018-01-28 MED ORDER — INSULIN STARTER KIT- PEN NEEDLES (ENGLISH)
1.0000 | Freq: Once | Status: DC
  Filled 2018-01-28 (×2): qty 1

## 2018-01-28 NOTE — Progress Notes (Signed)
Patient arrived to the unit via bed from the emergency department.  Patient is alert and oriented.  No compliant of pain.  Skin assessment complete.  Scrotum swollen with small incision from I & D in the ED.  Educated the patient on how to reach the staff on the unit.  Placed the call light within reach and lowered the bed.  Will continue to monitor the patient and notify MD as needed

## 2018-01-28 NOTE — Care Management Note (Signed)
Case Management Note  Patient Details  Name: De Jaworski MRN: [id number] Date of Birth: November 16, 1970  Subjective/Objective:   Sepsis. Lives alone. Independent with ADL's, no DME usage.          NCM received consult: S/p I/D of scrotal abscess-prior assistance with wound care initially after discharge; new DM diagnosis and will require prescription for glucometer at discharge-HgbA1c and at time of admission unclear if will be on oral agents versus insulin at discharge-may benefit from [facility] RN diabetes follow-up after discharge-hopefully patient's primary insurance has program available as well.   Pt without PCP. NCM scheduled hospital f/u with [facility]  With Dr. Volanda Napoleon for 02/07/2018 @ 2:30 pm. NCM made pt aware. NCM will f/u and make referral with home health agency if needed.  Action/Plan: Transition to home when medically stable.Marland KitchenMarland KitchenNCM to f/u with home health referral if needed @ d/c.  Pt states will have transportation to home @ d/c.  Expected Discharge Date:                  Expected Discharge Plan:  Home/Self Care  In-House Referral:     Discharge planning Services  CM Consult  Post Acute Care Choice:    Choice offered to:     DME Arranged:    DME Agency:     HH Arranged:    HH Agency:     Status of Service:  In process, will continue to follow  If discussed at Long Length of Stay Meetings, dates discussed:    Additional Comments:  Sharin Mons, RN 01/28/2018, 4:57 PM

## 2018-01-28 NOTE — Progress Notes (Signed)
Erik Rojas Demographics:    Erik Rojas, is a 47 y.o. male, DOB - 06/13/1971, NBV:[id number]  Admit date - 01/27/2018   Admitting Physician Lady Deutscher, MD  Outpatient Primary MD for the Erik Rojas is Erik Rojas, No Pcp Per  LOS - 1   Chief Complaint  Erik Rojas presents with  . Testicle Pain        Subjective:    [name] today has no fevers, no emesis,  No chest pain, no new concerns  Assessment  & Plan :    Principal Problem:   Sepsis ([place]) Active Problems:   Scrotal abscess   New onset type 2 diabetes mellitus (HCC)   HTN (hypertension)  Brief summary 47 y.o. male with medical history significant for untreated hypertension admitted on 01/27/2018 with scrotal abscess, and new diagnosis of DM, underwent I&D of right scrotum by urology on 01/27/2018   Plan:- 1) scrotal abscess mostly on the right side-status post I&D, white count down to 14 from 17, continue dressing change daily, continue Vanco and Zosyn, possible discharge home in 1 to 2 days on p.o. Bactrim per urology recommendations with outpatient urology follow-up in a week   2) new onset DM--Erik Rojas previously unaware of this diagnosis, hemoglobin A1c is 11.0, start metformin 500 twice daily, diabetic educator input appreciated, Lantus 10 units twice daily , Use Novolog/Humalog Sliding scale insulin with Accu-Cheks/Fingersticks as ordered   3)HTN-start amlodipine 2.5 mg daily, may switch to ACE inhibitor as outpatient once Erik Rojas has completed Bactrim  Code Status : Full  Disposition Plan  : home   Consults  :  Urology/diabetic educator   DVT Prophylaxis  : SCDs   Lab Results  Component Value Date   PLT 313 01/28/2018    Inpatient Medications  Scheduled Meds: . fluconazole  200 mg Oral Daily  . insulin aspart  0-15 Units Subcutaneous Q4H  . insulin aspart  5 Units Subcutaneous Once  . insulin  glargine  10 Units Subcutaneous Daily  . insulin starter kit- pen needles  1 kit Other Once  . metFORMIN  500 mg Oral BID WC  . sodium chloride flush  3 mL Intravenous Q12H   Continuous Infusions: . sodium chloride 100 mL/hr at 01/28/18 1012  . piperacillin-tazobactam (ZOSYN)  IV Stopped (01/28/18 1411)  . vancomycin Stopped (01/28/18 1645)   PRN Meds:.acetaminophen **OR** acetaminophen, morphine injection, ondansetron **OR** ondansetron (ZOFRAN) IV, oxyCODONE    Anti-infectives (From admission, onward)   Start     Dose/Rate Route Frequency Ordered Stop   01/27/18 2300  vancomycin (VANCOCIN) IVPB 1000 mg/200 mL premix     1,000 mg 200 mL/hr over 60 Minutes Intravenous Every 8 hours 01/27/18 1554     01/27/18 2100  vancomycin (VANCOCIN) IVPB 1000 mg/200 mL premix  Status:  Discontinued     1,000 mg 200 mL/hr over 60 Minutes Intravenous Every 8 hours 01/27/18 1218 01/27/18 1551   01/27/18 1830  piperacillin-tazobactam (ZOSYN) IVPB 3.375 g     3.375 g 12.5 mL/hr over 240 Minutes Intravenous Every 8 hours 01/27/18 1213     01/27/18 1730  fluconazole (DIFLUCAN) tablet 200 mg     200 mg Oral Daily 01/27/18 1632     01/27/18 1300  vancomycin (VANCOCIN) 2,000 mg in  sodium chloride 0.9 % 500 mL IVPB     2,000 mg 250 mL/hr over 120 Minutes Intravenous  Once 01/27/18 1213 01/27/18 1812   01/27/18 1215  piperacillin-tazobactam (ZOSYN) IVPB 3.375 g     3.375 g 100 mL/hr over 30 Minutes Intravenous  Once 01/27/18 1208 01/27/18 1252        Objective:   Vitals:   01/28/18 0508 01/28/18 0752 01/28/18 1200 01/28/18 1600  BP:   137/83 (!) 144/99  Pulse:   95 93  Resp:   20 (!) 22  Temp: 98.6 F (37 C) 99 F (37.2 C)    TempSrc: Oral Oral    SpO2:   96% 93%  Weight: 105.6 kg (232 lb 12.9 oz)       Wt Readings from Last 3 Encounters:  01/28/18 105.6 kg (232 lb 12.9 oz)     Intake/Output Summary (Last 24 hours) at 01/28/2018 1738 Last data filed at 01/28/2018 1014 Gross per 24 hour    Intake 1646.75 ml  Output 1400 ml  Net 246.75 ml     Physical Exam  Gen:- Awake Alert,  In no apparent distress  HEENT:- [place].AT, No sclera icterus Neck-Supple Neck,No JVD,.  Lungs-  CTAB , good air movement CV- S1, S2 normal Abd-  +ve B.Sounds, Abd Soft, No tenderness,    Extremity/Skin:- No  edema, good pulses Psych-affect is appropriate, oriented x3 Neuro-no new focal deficits, no tremors GU-significant scrotal erythema/swelling/induration/tenderness/warmth, I&D wound with iodoform gauze with serosanguineous drainage   Data Review:   Micro Results Recent Results (from the past 240 hour(s))  Blood Culture (routine x 2)     Status: None (Preliminary result)   Collection Time: 01/27/18 12:07 PM  Result Value Ref Range Status   Specimen Description BLOOD RIGHT ANTECUBITAL  Final   Special Requests   Final    BOTTLES DRAWN AEROBIC AND ANAEROBIC Blood Culture adequate volume   Culture   Final    NO GROWTH 1 DAY Performed at [facility], [place] [address], [place], [place] [address]    Report Status PENDING  Incomplete  Blood Culture (routine x 2)     Status: None (Preliminary result)   Collection Time: 01/27/18 12:12 PM  Result Value Ref Range Status   Specimen Description BLOOD BLOOD LEFT FOREARM  Final   Special Requests   Final    BOTTLES DRAWN AEROBIC AND ANAEROBIC Blood Culture adequate volume   Culture   Final    NO GROWTH 1 DAY Performed at [facility], [place] [address], [place], [place] [address]    Report Status PENDING  Incomplete  MRSA PCR Screening     Status: None   Collection Time: 01/27/18  8:23 PM  Result Value Ref Range Status   MRSA by PCR NEGATIVE NEGATIVE Final    Comment:        The GeneXpert MRSA Assay (FDA approved for NASAL specimens only), is one component of a comprehensive MRSA colonization surveillance program. It is not intended to diagnose MRSA infection nor to guide or monitor treatment for MRSA infections. Performed at  [facility], [place] [address] [place], [place] [address]     Radiology Reports Ct Pelvis W Contrast  Result Date: 01/27/2018 CLINICAL DATA:  Testicular swelling and pain for 2 days. EXAM: CT PELVIS WITH CONTRAST TECHNIQUE: Multidetector CT imaging of the pelvis was performed using the standard protocol following the bolus administration of intravenous contrast. CONTRAST:  70m OMNIPAQUE IOHEXOL 300  MG/ML  SOLN COMPARISON:  None. FINDINGS: Urinary Tract:  No abnormality visualized. Bowel:  Unremarkable visualized pelvic bowel loops. Vascular/Lymphatic: No pathologically enlarged lymph nodes. No significant vascular abnormality seen. Reproductive: Prostate gland is unremarkable. Extensive soft tissue emphysema involving the right scrotum, surrounding the testicle and extending into the spermatic cord, most concerning for Fournier's gangrene. Other:  None. Musculoskeletal: No acute osseous abnormality. No aggressive osseous lesion. IMPRESSION: 1. Extensive soft tissue emphysema involving the right scrotum, surrounding the testicle and extending into the spermatic cord, most concerning for Fournier's gangrene. Critical Value/emergent results were called by telephone at the time of interpretation on 01/27/2018 at 1:07 pm to [facility] , who verbally acknowledged these results. Electronically Signed   By: Kathreen Devoid   On: 01/27/2018 13:07   US Scrotum W/doppler  Result Date: 01/27/2018 CLINICAL DATA:  Right greater than left scrotal swelling, pain EXAM: SCROTAL ULTRASOUND DOPPLER ULTRASOUND OF THE TESTICLES TECHNIQUE: Complete ultrasound examination of the testicles, epididymis, and other scrotal structures was performed. Color and spectral Doppler ultrasound were also utilized to evaluate blood flow to the testicles. COMPARISON:  None. FINDINGS: Right testicle Measurements: 4.5 x 2.3 x 3.6 cm. No mass or microlithiasis visualized. Left testicle Measurements: 4.9 x 1.9 x 3.2 cm. No mass or  microlithiasis visualized. Right epididymis:  5 mm epididymal head cyst. Left epididymis:  Normal in size and appearance. Hydrocele:  None visualized. Varicocele:  None visualized. Pulsed Doppler interrogation of both testes demonstrates normal low resistance arterial and venous waveforms bilaterally. Other: Extensive echogenic foci are noted throughout the right scrotum with posterior acoustic shadowing. This is concerning for possible scrotal soft tissue gas. Cannot exclude Fournier's gangrene. Recommend further evaluation with he pelvic CT. IMPRESSION: Echogenic foci with shadowing noted throughout the right scrotal soft tissues concerning for gas. Cannot exclude Fournier's gangrene. Recommend further evaluation with pelvic CT. No testicular abnormality. These results were called by telephone at the time of interpretation on 01/27/2018 at 11:27 am to [facility] , who verbally acknowledged these results. Electronically Signed   By: Rolm Baptise M.D.   On: 01/27/2018 11:27     CBC Recent Labs  Lab 01/27/18 1029 01/28/18 0715  WBC 17.3* 14.8*  HGB 17.5* 13.7  HCT 48.5 38.3*  PLT 327 313  MCV 86.0 86.5  MCH 31.0 30.9  MCHC 36.1* 35.8  RDW 12.2 12.6    Chemistries  Recent Labs  Lab 01/27/18 1029 01/27/18 1724 01/28/18 0715  NA 134* 134* 134*  K 3.6 3.6 3.3*  CL 95* 98* 99*  CO2 _0 GLUCOSE 463* 361* 295*  BUN _1 CREATININE 1.03 1.06 0.92  CALCIUM 8.7* 7.9* 7.7*  MG  --  1.9  --   AST  --   --  17  ALT  --   --  16*  ALKPHOS  --   --  93  BILITOT  --   --  1.1   ------------------------------------------------------------------------------------------------------------------ No results for input(s): CHOL, HDL, LDLCALC, TRIG, CHOLHDL, LDLDIRECT in the last 72 hours.  Lab Results  Component Value Date   HGBA1C 11.0 (H) 01/27/2018   ------------------------------------------------------------------------------------------------------------------ No results for  input(s): TSH, T4TOTAL, T3FREE, THYROIDAB in the last 72 hours.  Invalid input(s): FREET3 ------------------------------------------------------------------------------------------------------------------ No results for input(s): VITAMINB12, FOLATE, FERRITIN, TIBC, IRON, RETICCTPCT in the last 72 hours.  Coagulation profile No results for input(s): INR, PROTIME in the last 168 hours.  No results for input(s): DDIMER in the last 72 hours.  Cardiac Enzymes No results for input(s): CKMB, TROPONINI, MYOGLOBIN in the last 168 hours.  Invalid input(s): CK ------------------------------------------------------------------------------------------------------------------ No results found for: BNP   Roxan Hockey M.D on 01/28/2018 at 5:38 PM  Between 7am to 7pm - Pager - [phone]  After 7pm go to [email or web address] - password [email or web address]  [facility] -  Office  [phone]   Voice Recognition Viviann Spare dictation system was used to create this note, attempts have been made to correct errors. Please contact the author with questions and/or clarifications.

## 2018-01-28 NOTE — Progress Notes (Signed)
Subjective: Patient reports improvement in his scrotal pain. Mild drainage from scrotum. Erythema and edema inproved  Objective: Vital signs in last 24 hours: Temp:  [98.6 F (37 C)-99 F (37.2 C)] 99 F (37.2 C) (06/14 0752) Pulse Rate:  [95-116] 95 (06/14 1200) Resp:  [14-31] 20 (06/14 1200) BP: (126-142)/(78-90) 137/83 (06/14 1200) SpO2:  [92 %-96 %] 96 % (06/14 1200) Weight:  [105.6 kg (232 lb 12.9 oz)] 105.6 kg (232 lb 12.9 oz) (06/14 0508)  Intake/Output from previous day: 06/13 0701 - 06/14 0700 In: 1403.8 [I.V.:1100; IV Piggyback:303.8] Out: 850 [Urine:850] Intake/Output this shift: Total I/O In: 243 [P.O.:240; I.V.:3] Out: 550 [Urine:550]  Physical Exam:  General:alert, cooperative and appears stated age GI: soft, non tender, normal bowel sounds, no palpable masses, no organomegaly, no inguinal hernia Male genitalia: no penile lesions or discharge no testicular masses Scrotum: abscess: right Extremities: extremities normal, atraumatic, no cyanosis or edema  Lab Results: Recent Labs    01/27/18 1029 01/28/18 0715  HGB 17.5* 13.7  HCT 48.5 38.3*   BMET Recent Labs    01/27/18 1724 01/28/18 0715  NA 134* 134*  K 3.6 3.3*  CL 98* 99*  CO2 23 27  GLUCOSE 361* 295*  BUN 13 11  CREATININE 1.06 0.92  CALCIUM 7.9* 7.7*   No results for input(s): LABPT, INR in the last 72 hours. No results for input(s): LABURIN in the last 72 hours. Results for orders placed or performed during the hospital encounter of 01/27/18  Blood Culture (routine x 2)     Status: None (Preliminary result)   Collection Time: 01/27/18 12:07 PM  Result Value Ref Range Status   Specimen Description BLOOD RIGHT ANTECUBITAL  Final   Special Requests   Final    BOTTLES DRAWN AEROBIC AND ANAEROBIC Blood Culture adequate volume   Culture   Final    NO GROWTH 1 DAY Performed at [facility], [place] [address] [place], [place] [address]    Report Status PENDING  Incomplete  Blood  Culture (routine x 2)     Status: None (Preliminary result)   Collection Time: 01/27/18 12:12 PM  Result Value Ref Range Status   Specimen Description BLOOD BLOOD LEFT FOREARM  Final   Special Requests   Final    BOTTLES DRAWN AEROBIC AND ANAEROBIC Blood Culture adequate volume   Culture   Final    NO GROWTH 1 DAY Performed at [facility], [place] [address] [place], [place] [address]    Report Status PENDING  Incomplete  MRSA PCR Screening     Status: None   Collection Time: 01/27/18  8:23 PM  Result Value Ref Range Status   MRSA by PCR NEGATIVE NEGATIVE Final    Comment:        The GeneXpert MRSA Assay (FDA approved for NASAL specimens only), is one component of a comprehensive MRSA colonization surveillance program. It is not intended to diagnose MRSA infection nor to guide or monitor treatment for MRSA infections. Performed at [facility], [place] [address], [place], [place] [address]     Studies/Results: Ct Pelvis W Contrast  Result Date: 01/27/2018 CLINICAL DATA:  Testicular swelling and pain for 2 days. EXAM: CT PELVIS WITH CONTRAST TECHNIQUE: Multidetector CT imaging of the pelvis was performed using the standard protocol following the bolus administration of intravenous contrast. CONTRAST:  75mL OMNIPAQUE IOHEXOL 300 MG/ML  SOLN COMPARISON:  None. FINDINGS: Urinary Tract:  No abnormality visualized. Bowel:  Unremarkable  visualized pelvic bowel loops. Vascular/Lymphatic: No pathologically enlarged lymph nodes. No significant vascular abnormality seen. Reproductive: Prostate gland is unremarkable. Extensive soft tissue emphysema involving the right scrotum, surrounding the testicle and extending into the spermatic cord, most concerning for Fournier's gangrene. Other:  None. Musculoskeletal: No acute osseous abnormality. No aggressive osseous lesion. IMPRESSION: 1. Extensive soft tissue emphysema involving the right scrotum, surrounding the testicle and extending into  the spermatic cord, most concerning for Fournier's gangrene. Critical Value/emergent results were called by telephone at the time of interpretation on 01/27/2018 at 1:07 pm to [facility] , who verbally acknowledged these results. Electronically Signed   By: Kathreen Devoid   On: 01/27/2018 13:07   US Scrotum W/doppler  Result Date: 01/27/2018 CLINICAL DATA:  Right greater than left scrotal swelling, pain EXAM: SCROTAL ULTRASOUND DOPPLER ULTRASOUND OF THE TESTICLES TECHNIQUE: Complete ultrasound examination of the testicles, epididymis, and other scrotal structures was performed. Color and spectral Doppler ultrasound were also utilized to evaluate blood flow to the testicles. COMPARISON:  None. FINDINGS: Right testicle Measurements: 4.5 x 2.3 x 3.6 cm. No mass or microlithiasis visualized. Left testicle Measurements: 4.9 x 1.9 x 3.2 cm. No mass or microlithiasis visualized. Right epididymis:  5 mm epididymal head cyst. Left epididymis:  Normal in size and appearance. Hydrocele:  None visualized. Varicocele:  None visualized. Pulsed Doppler interrogation of both testes demonstrates normal low resistance arterial and venous waveforms bilaterally. Other: Extensive echogenic foci are noted throughout the right scrotum with posterior acoustic shadowing. This is concerning for possible scrotal soft tissue gas. Cannot exclude Fournier's gangrene. Recommend further evaluation with he pelvic CT. IMPRESSION: Echogenic foci with shadowing noted throughout the right scrotal soft tissues concerning for gas. Cannot exclude Fournier's gangrene. Recommend further evaluation with pelvic CT. No testicular abnormality. These results were called by telephone at the time of interpretation on 01/27/2018 at 11:27 am to [facility] , who verbally acknowledged these results. Electronically Signed   By: Rolm Baptise M.D.   On: 01/27/2018 11:27    Assessment/Plan: 47yo with right scrotal abscess s/p I&D  1. continue antibiotics for  1 week. Continue daily packing chang. followup with [facility] in 2 weeks for wound check   LOS: 1 day   Nicolette Bang 01/28/2018, 3:59 PM

## 2018-01-28 NOTE — Progress Notes (Signed)
Inpatient Diabetes Program Recommendations  AACE/ADA: New Consensus Statement on Inpatient Glycemic Control (2015)  Target Ranges:  Prepandial:   less than 140 mg/dL      Peak postprandial:   less than 180 mg/dL (1-2 hours)      Critically ill patients:  140 - 180 mg/dL   Lab Results  Component Value Date   GLUCAP 275 (H) 01/28/2018   HGBA1C 11.0 (H) 01/27/2018    Review of Glycemic ControlResults for Erik Rojas, Erik Rojas (MRN [id number]) as of 01/28/2018 11:24  Ref. Range 01/27/2018 18:25 01/27/2018 20:23 01/27/2018 22:41 01/28/2018 03:53 01/28/2018 07:51  Glucose-Capillary Latest Ref Range: 65 - 99 mg/dL 346 (H) 370 (H) 321 (H) 255 (H) 275 (H)    Diabetes history: Type 2 DM - New onset Outpatient Diabetes medications:  None Current orders for Inpatient glycemic control:  Novolog moderate q 4 hours, Lantus 10 units daily  Inpatient Diabetes Program Recommendations:    Spoke with patient regarding new diagnosis of DM.  He does have a family history noting that his mother has DM.  We discussed Type 2 DM and insulin resistance.  We further discussed signs of high blood sugars and low blood sugars.  Patient was not experiencing any signs of high blood sugars stating he was surprised with new diagnosis.  We discussed importance of controlling blood sugars for infection healing.  Briefly discussed likely need of insulin due to high A1C of 11%.  Explained what A1C is and that he will likely need insulin at least initially.  Needs PCP for DM management.  Spoke with RN and she will begin working with patient on insulin and checking blood sugars.  Ordered LWWD booklet, DM videos, insulin starter kit, and dietician.  Will check on patient this PM and answer further questions and teach insulin pen.   MD, please consider increasing Lantus to 20 units daily.  Based on A1C, patient will likely need atleast basal insulin with oral agent at d/c.    Thanks,  Adah Perl, RN, BC-ADM Inpatient  Diabetes Coordinator Pager [phone]

## 2018-01-28 NOTE — Progress Notes (Signed)
Attempted to set up diabetic teaching videos for patient per order, but system was down. Will let oncoming nurse know.

## 2018-01-28 NOTE — Plan of Care (Signed)
  RD consulted for nutrition education regarding diabetes.   Lab Results  Component Value Date   HGBA1C 11.0 (H) 01/27/2018   CBGS: 255-370. Current orders for glycemic control are 0-15 units insulin aspart TID with meals, 5 units insulin aspart daily, 10 units insulin glargine daily, and 500 mg metformin BID.   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 He eats out the majority of the time (Breakfast: sausage and egg biscuit, Lunch: sandwich, Dinner: cereal). Pt reports he consumes some water, but mainly gatorade. He also has cut back on sodas over the past year; he usually drinks one bottle soda about 3 times per week. He knows he needs to further decrease sugary beverages. Informed pt about either G2 Gatorade or Powerade Zero, which contain significantly less carbohydrates.   Pt was just informed of diagnosis but eager to learn. Discussed education process including diabetes coordinator consult, as well as Living Well with DM booklet and educational videos.   Pt able to teachback signs and symptoms of hypogylcemia and hyerpglycemia and how to treat. Also discussed importance of self-management (checking CBGS, establishing regular meal pattern, taking medications, and regular PCP follow-up) for close monitoring and decreasing complications. RNCM to help with establishing PCP.   Encouraged pt to watch education videos for further reinforcement. Will also order outpatient DM education.   RD provided "Carbohydrate Counting for People with Diabetes" handout from the Academy of Nutrition and Dietetics. Discussed different food groups and their effects on blood sugar, emphasizing carbohydrate-containing foods. Provided list of carbohydrates and recommended serving sizes of common foods.  Discussed importance of controlled and consistent carbohydrate intake throughout the day. Provided  examples of ways to balance meals/snacks and encouraged intake of high-fiber, whole grain complex carbohydrates. Teach back method used.  Expect good compliance.  Current diet order is Heart Healthy/ Carb Modified, patient is consuming approximately 100% of meals at this time. Labs and medications reviewed. No further nutrition interventions warranted at this time. RD contact information provided. If additional nutrition issues arise, please re-consult RD.  Hasten Sweitzer A. Jimmye Norman, RD, LDN, CDE Pager: [phone] After hours Pager: [phone]

## 2018-01-28 NOTE — Progress Notes (Signed)
Visited with patient this afternoon to further discuss the survival skills of DM.  Discussed normal blood sugar levels, checking blood sugars, symptoms of high and low blood sugars and how to treat a low blood sugar.  Patient was able to teach back proper treatment of low blood sugar and that he would recheck blood sugar 15 minutes after treatment.  He has Living Well with DM booklet at bedside. Showed him how to use insulin pen including how to put on needle, 2 unit prime, administration, and holding in place for 6-10 seconds. We also discussed proper disposal of sharps.    Patient will need Rx. For Lantus Solostar pen, insulin pen needles [phone]), glucose meter.    Patient is also interested in [facility] glucose sensor.  If D/C'd on Monday, he should qualify for [employer] CGM study (Order # [id number]).   Will follow.   Thanks,  Adah Perl, RN, BC-ADM Inpatient Diabetes Coordinator Pager [phone] (8a-5p)

## 2018-01-29 DIAGNOSIS — A419 Sepsis, unspecified organism: Principal | ICD-10-CM

## 2018-01-29 LAB — URINE CULTURE: CULTURE: NO GROWTH

## 2018-01-29 LAB — GLUCOSE, CAPILLARY
GLUCOSE-CAPILLARY: 187 mg/dL — AB (ref 65–99)
GLUCOSE-CAPILLARY: 217 mg/dL — AB (ref 65–99)
GLUCOSE-CAPILLARY: 227 mg/dL — AB (ref 65–99)
Glucose-Capillary: 166 mg/dL — ABNORMAL HIGH (ref 65–99)
Glucose-Capillary: 182 mg/dL — ABNORMAL HIGH (ref 65–99)
Glucose-Capillary: 199 mg/dL — ABNORMAL HIGH (ref 65–99)
Glucose-Capillary: 219 mg/dL — ABNORMAL HIGH (ref 65–99)

## 2018-01-29 MED ORDER — AMLODIPINE BESYLATE 2.5 MG PO TABS
2.5000 mg | ORAL_TABLET | Freq: Once | ORAL | Status: AC
  Administered 2018-01-29: 2.5 mg via ORAL
  Filled 2018-01-29: qty 1

## 2018-01-29 MED ORDER — AMLODIPINE BESYLATE 5 MG PO TABS
5.0000 mg | ORAL_TABLET | Freq: Every day | ORAL | Status: DC
  Administered 2018-01-30: 5 mg via ORAL
  Filled 2018-01-29: qty 1

## 2018-01-29 MED ORDER — INSULIN GLARGINE 100 UNIT/ML ~~LOC~~ SOLN
25.0000 [IU] | Freq: Every day | SUBCUTANEOUS | Status: DC
  Filled 2018-01-29: qty 0.25

## 2018-01-29 MED ORDER — INSULIN GLARGINE 100 UNIT/ML ~~LOC~~ SOLN: 20.0000 [IU] | Freq: Every day | SUBCUTANEOUS | Status: DC

## 2018-01-29 NOTE — Progress Notes (Signed)
Patient Demographics:    Erik Rojas, is a 47 y.o. male, DOB - 22-Jun-1971, FAO:[id number]  Admit date - 01/27/2018   Admitting Physician Lady Deutscher, MD  Outpatient Primary MD for the patient is Patient, No Pcp Per  LOS - 2   Chief Complaint  Patient presents with  . Testicle Pain        Subjective:    Erik Rojas today has no fevers, no emesis,  No chest pain, no new concerns, blood sugars remain elevated  Assessment  & Plan :    Principal Problem:   Sepsis ([place]) Active Problems:   Scrotal abscess   New onset type 2 diabetes mellitus ([place])   HTN (hypertension)  Brief summary 47 y.o. male with medical history significant for untreated hypertension admitted on 01/27/2018 with scrotal abscess, and new diagnosis of DM, underwent I&D of right scrotum by urology on 01/27/2018   Plan:- 1) scrotal abscess mostly on the right side-status post I&D, WBC trending down continue dressing change daily, continue Vanco and Zosyn, possible discharge home on 01/30/2018 on p.o. Bactrim and Keflex,  outpatient urology follow-up in a week   2) new onset DM--patient previously unaware of this diagnosis, hemoglobin A1c is 11.0, patient may restart metformin 500 twice daily after completing Bactrim, diabetic educator input appreciated, change Lantus to 25 units nightly, Use Novolog/Humalog Sliding scale insulin with Accu-Cheks/Fingersticks as ordered   3)HTN-BP is not at goal, increase amlodipine to 5 mg daily , , may switch to ACE inhibitor as outpatient once patient has completed Bactrim  Code Status : Full  Disposition Plan  : home   Consults  :  Urology/diabetic educator   DVT Prophylaxis  : SCDs   Lab Results  Component Value Date   PLT 313 01/28/2018    Inpatient Medications  Scheduled Meds: . [START ON 01/30/2018] amLODipine  5 mg Oral Daily  . fluconazole  200 mg  Oral Daily  . insulin aspart  0-15 Units Subcutaneous Q4H  . insulin aspart  5 Units Subcutaneous Once  . [START ON 01/30/2018] insulin glargine  20 Units Subcutaneous QHS  . insulin starter kit- pen needles  1 kit Other Once  . metFORMIN  500 mg Oral BID WC  . sodium chloride flush  3 mL Intravenous Q12H   Continuous Infusions: . sodium chloride 100 mL/hr at 01/29/18 0622  . piperacillin-tazobactam (ZOSYN)  IV 3.375 g (01/29/18 1049)  . vancomycin Stopped (01/29/18 0719)   PRN Meds:.acetaminophen **OR** acetaminophen, hydrALAZINE, morphine injection, ondansetron **OR** ondansetron (ZOFRAN) IV, oxyCODONE    Anti-infectives (From admission, onward)   Start     Dose/Rate Route Frequency Ordered Stop   01/27/18 2300  vancomycin (VANCOCIN) IVPB 1000 mg/200 mL premix     1,000 mg 200 mL/hr over 60 Minutes Intravenous Every 8 hours 01/27/18 1554     01/27/18 2100  vancomycin (VANCOCIN) IVPB 1000 mg/200 mL premix  Status:  Discontinued     1,000 mg 200 mL/hr over 60 Minutes Intravenous Every 8 hours 01/27/18 1218 01/27/18 1551   01/27/18 1830  piperacillin-tazobactam (ZOSYN) IVPB 3.375 g     3.375 g 12.5 mL/hr over 240 Minutes Intravenous Every 8 hours 01/27/18 1213     01/27/18 1730  fluconazole (DIFLUCAN)  tablet 200 mg     200 mg Oral Daily 01/27/18 1632     01/27/18 1300  vancomycin (VANCOCIN) 2,000 mg in sodium chloride 0.9 % 500 mL IVPB     2,000 mg 250 mL/hr over 120 Minutes Intravenous  Once 01/27/18 1213 01/27/18 1812   01/27/18 1215  piperacillin-tazobactam (ZOSYN) IVPB 3.375 g     3.375 g 100 mL/hr over 30 Minutes Intravenous  Once 01/27/18 1208 01/27/18 1252        Objective:   Vitals:   01/29/18 0414 01/29/18 0500 01/29/18 0621 01/29/18 0756  BP:   (!) 138/95 (!) 142/96  Pulse:   94 86  Resp:   (!) 24 (!) 21  Temp: 99.1 F (37.3 C)   98.4 F (36.9 C)  TempSrc: Oral   Oral  SpO2:   92% 96%  Weight:  108.6 kg (239 lb 6.7 oz)      Wt Readings from Last 3  Encounters:  01/29/18 108.6 kg (239 lb 6.7 oz)     Intake/Output Summary (Last 24 hours) at 01/29/2018 1416 Last data filed at 01/29/2018 1300 Gross per 24 hour  Intake 240 ml  Output 1225 ml  Net -985 ml     Physical Exam  Gen:- Awake Alert,  In no apparent distress  HEENT:- [place].AT, No sclera icterus Neck-Supple Neck,No JVD,.  Lungs-  CTAB , good air movement CV- S1, S2 normal Abd-  +ve B.Sounds, Abd Soft, No tenderness,    Extremity/Skin:- No  edema, good pulses Psych-affect is appropriate, oriented x3 Neuro-no new focal deficits, no tremors GU-significant scrotal erythema/swelling/induration/tenderness/warmth, I&D wound with iodoform gauze less amount of serosanguineous drainage   Data Review:   Micro Results Recent Results (from the past 240 hour(s))  Blood Culture (routine x 2)     Status: None (Preliminary result)   Collection Time: 01/27/18 12:07 PM  Result Value Ref Range Status   Specimen Description BLOOD RIGHT ANTECUBITAL  Final   Special Requests   Final    BOTTLES DRAWN AEROBIC AND ANAEROBIC Blood Culture adequate volume   Culture   Final    NO GROWTH 2 DAYS Performed at [facility], [place] [address] [place], [place] [address]    Report Status PENDING  Incomplete  Blood Culture (routine x 2)     Status: None (Preliminary result)   Collection Time: 01/27/18 12:12 PM  Result Value Ref Range Status   Specimen Description BLOOD BLOOD LEFT FOREARM  Final   Special Requests   Final    BOTTLES DRAWN AEROBIC AND ANAEROBIC Blood Culture adequate volume   Culture   Final    NO GROWTH 2 DAYS Performed at [facility], [place] [address] [place], [place] [address]    Report Status PENDING  Incomplete  MRSA PCR Screening     Status: None   Collection Time: 01/27/18  8:23 PM  Result Value Ref Range Status   MRSA by PCR NEGATIVE NEGATIVE Final    Comment:        The GeneXpert MRSA Assay (FDA approved for NASAL specimens only), is one component of  a comprehensive MRSA colonization surveillance program. It is not intended to diagnose MRSA infection nor to guide or monitor treatment for MRSA infections. Performed at [facility], [place] [address], [place], [place] [address]     Radiology Reports Ct Pelvis W Contrast  Result Date: 01/27/2018 CLINICAL DATA:  Testicular swelling and pain for 2 days. EXAM: CT PELVIS  WITH CONTRAST TECHNIQUE: Multidetector CT imaging of the pelvis was performed using the standard protocol following the bolus administration of intravenous contrast. CONTRAST:  61m OMNIPAQUE IOHEXOL 300 MG/ML  SOLN COMPARISON:  None. FINDINGS: Urinary Tract:  No abnormality visualized. Bowel:  Unremarkable visualized pelvic bowel loops. Vascular/Lymphatic: No pathologically enlarged lymph nodes. No significant vascular abnormality seen. Reproductive: Prostate gland is unremarkable. Extensive soft tissue emphysema involving the right scrotum, surrounding the testicle and extending into the spermatic cord, most concerning for Fournier's gangrene. Other:  None. Musculoskeletal: No acute osseous abnormality. No aggressive osseous lesion. IMPRESSION: 1. Extensive soft tissue emphysema involving the right scrotum, surrounding the testicle and extending into the spermatic cord, most concerning for Fournier's gangrene. Critical Value/emergent results were called by telephone at the time of interpretation on 01/27/2018 at 1:07 pm to [facility], who verbally acknowledged these results. Electronically Signed   By: HKathreen Devoid  On: 01/27/2018 13:07   [place]oreaScrotum W/doppler  Result Date: 01/27/2018 CLINICAL DATA:  Right greater than left scrotal swelling, pain EXAM: SCROTAL ULTRASOUND DOPPLER ULTRASOUND OF THE TESTICLES TECHNIQUE: Complete ultrasound examination of the testicles, epididymis, and other scrotal structures was performed. Color and spectral Doppler ultrasound were also utilized to evaluate blood flow to the testicles.  COMPARISON:  None. FINDINGS: Right testicle Measurements: 4.5 x 2.3 x 3.6 cm. No mass or microlithiasis visualized. Left testicle Measurements: 4.9 x 1.9 x 3.2 cm. No mass or microlithiasis visualized. Right epididymis:  5 mm epididymal head cyst. Left epididymis:  Normal in size and appearance. Hydrocele:  None visualized. Varicocele:  None visualized. Pulsed Doppler interrogation of both testes demonstrates normal low resistance arterial and venous waveforms bilaterally. Other: Extensive echogenic foci are noted throughout the right scrotum with posterior acoustic shadowing. This is concerning for possible scrotal soft tissue gas. Cannot exclude Fournier's gangrene. Recommend further evaluation with he pelvic CT. IMPRESSION: Echogenic foci with shadowing noted throughout the right scrotal soft tissues concerning for gas. Cannot exclude Fournier's gangrene. Recommend further evaluation with pelvic CT. No testicular abnormality. These results were called by telephone at the time of interpretation on 01/27/2018 at 11:27 am to [facility], who verbally acknowledged these results. Electronically Signed   By: KRolm BaptiseM.D.   On: 01/27/2018 11:27     CBC Recent Labs  Lab 01/27/18 1029 01/28/18 0715  WBC 17.3* 14.8*  HGB 17.5* 13.7  HCT 48.5 38.3*  PLT 327 313  MCV 86.0 86.5  MCH 31.0 30.9  MCHC 36.1* 35.8  RDW 12.2 12.6    Chemistries  Recent Labs  Lab 01/27/18 1029 01/27/18 1724 01/28/18 0715  NA 134* 134* 134*  K 3.6 3.6 3.3*  CL 95* 98* 99*  CO2 _0 GLUCOSE 463* 361* 295*  BUN _1 CREATININE 1.03 1.06 0.92  CALCIUM 8.7* 7.9* 7.7*  MG  --  1.9  --   AST  --   --  17  ALT  --   --  16*  ALKPHOS  --   --  93  BILITOT  --   --  1.1   ------------------------------------------------------------------------------------------------------------------ No results for input(s): CHOL, HDL, LDLCALC, TRIG, CHOLHDL, LDLDIRECT in the last 72 hours.  Lab Results  Component  Value Date   HGBA1C 11.0 (H) 01/27/2018   ------------------------------------------------------------------------------------------------------------------ No results for input(s): TSH, T4TOTAL, T3FREE, THYROIDAB in the last 72 hours.  Invalid input(s): FREET3 ------------------------------------------------------------------------------------------------------------------ No results for input(s): VITAMINB12, FOLATE, FERRITIN, TIBC, IRON, RETICCTPCT in the  last 72 hours.  Coagulation profile No results for input(s): INR, PROTIME in the last 168 hours.  No results for input(s): DDIMER in the last 72 hours.  Cardiac Enzymes No results for input(s): CKMB, TROPONINI, MYOGLOBIN in the last 168 hours.  Invalid input(s): CK ------------------------------------------------------------------------------------------------------------------ No results found for: BNP   Roxan Hockey M.D on 01/29/2018 at 2:16 PM  Between 7am to 7pm - Pager - [phone]  After 7pm go to [email or web address] - password [email or web address]  [facility] -  Office  [phone]   Voice Recognition Viviann Spare dictation system was used to create this note, attempts have been made to correct errors. Please contact the author with questions and/or clarifications.

## 2018-01-30 LAB — CBC
HCT: 38.4 % — ABNORMAL LOW (ref 39.0–52.0)
HCT: 40.2 % (ref 39.0–52.0)
HEMOGLOBIN: 13.2 g/dL (ref 13.0–17.0)
HEMOGLOBIN: 13.9 g/dL (ref 13.0–17.0)
MCH: 29.8 pg (ref 26.0–34.0)
MCH: 30.4 pg (ref 26.0–34.0)
MCHC: 34.4 g/dL (ref 30.0–36.0)
MCHC: 34.6 g/dL (ref 30.0–36.0)
MCV: 86.7 fL (ref 78.0–100.0)
MCV: 88 fL (ref 78.0–100.0)
PLATELETS: 355 10*3/uL (ref 150–400)
Platelets: 362 10*3/uL (ref 150–400)
RBC: 4.43 MIL/uL (ref 4.22–5.81)
RBC: 4.57 MIL/uL (ref 4.22–5.81)
RDW: 12.6 % (ref 11.5–15.5)
RDW: 12.7 % (ref 11.5–15.5)
WBC: 12.7 10*3/uL — ABNORMAL HIGH (ref 4.0–10.5)
WBC: 12.4 10*3/uL — ABNORMAL HIGH (ref 4.0–10.5)

## 2018-01-30 LAB — BASIC METABOLIC PANEL
ANION GAP: 9 (ref 5–15)
Anion gap: 9 (ref 5–15)
BUN: 6 mg/dL (ref 6–20)
BUN: 7 mg/dL (ref 6–20)
CHLORIDE: 106 mmol/L (ref 101–111)
CHLORIDE: 106 mmol/L (ref 101–111)
CO2: 23 mmol/L (ref 22–32)
CO2: 23 mmol/L (ref 22–32)
Calcium: 7.5 mg/dL — ABNORMAL LOW (ref 8.9–10.3)
Calcium: 7.6 mg/dL — ABNORMAL LOW (ref 8.9–10.3)
Creatinine, Ser: 0.77 mg/dL (ref 0.61–1.24)
Creatinine, Ser: 0.76 mg/dL (ref 0.61–1.24)
GFR calc Af Amer: >60 mL/min (ref >60)
GFR calc non Af Amer: >60 mL/min (ref >60)
GFR calc non Af Amer: >60 mL/min (ref >60)
GLUCOSE: 138 mg/dL — AB (ref 65–99)
Glucose, Bld: 139 mg/dL — ABNORMAL HIGH (ref 65–99)
POTASSIUM: 3.3 mmol/L — AB (ref 3.5–5.1)
POTASSIUM: 3 mmol/L — AB (ref 3.5–5.1)
Sodium: 138 mmol/L (ref 135–145)
Sodium: 138 mmol/L (ref 135–145)

## 2018-01-30 LAB — GLUCOSE, CAPILLARY
GLUCOSE-CAPILLARY: 259 mg/dL — AB (ref 65–99)
GLUCOSE-CAPILLARY: 254 mg/dL — AB (ref 65–99)
Glucose-Capillary: 144 mg/dL — ABNORMAL HIGH (ref 65–99)
Glucose-Capillary: 132 mg/dL — ABNORMAL HIGH (ref 65–99)
Glucose-Capillary: 217 mg/dL — ABNORMAL HIGH (ref 65–99)

## 2018-01-30 MED ORDER — INSULIN GLARGINE 100 UNIT/ML SOLOSTAR PEN: 25.0000 [IU] | PEN_INJECTOR | Freq: Every day | SUBCUTANEOUS | 5 refills | Status: DC

## 2018-01-30 MED ORDER — INSULIN ASPART 100 UNIT/ML FLEXPEN: 3.0000 [IU] | PEN_INJECTOR | Freq: Three times a day (TID) | SUBCUTANEOUS | 5 refills | Status: DC

## 2018-01-30 MED ORDER — POTASSIUM CHLORIDE CRYS ER 20 MEQ PO TBCR
40.0000 meq | EXTENDED_RELEASE_TABLET | ORAL | Status: AC
  Administered 2018-01-30 (×2): 40 meq via ORAL
  Filled 2018-01-30 (×2): qty 2

## 2018-01-30 MED ORDER — INSULIN STARTER KIT- PEN NEEDLES (ENGLISH): 1.0000 | Freq: Once | 2 refills | Status: AC

## 2018-01-30 MED ORDER — AMLODIPINE BESYLATE 5 MG PO TABS: 5.0000 mg | ORAL_TABLET | Freq: Every day | ORAL | 5 refills | Status: DC

## 2018-01-30 MED ORDER — "PEN NEEDLES 3/16"" 31G X 5 MM MISC": 5 refills | Status: DC

## 2018-01-30 MED ORDER — POTASSIUM CHLORIDE CRYS ER 20 MEQ PO TBCR: 40.0000 meq | EXTENDED_RELEASE_TABLET | Freq: Once | ORAL | Status: DC

## 2018-01-30 MED ORDER — FLUCONAZOLE 200 MG PO TABS: 200.0000 mg | ORAL_TABLET | Freq: Every day | ORAL | 0 refills | Status: DC

## 2018-01-30 MED ORDER — BLOOD GLUCOSE METER KIT: PACK | 3 refills | Status: AC

## 2018-01-30 MED ORDER — METFORMIN HCL 1000 MG PO TABS: 1000.0000 mg | ORAL_TABLET | Freq: Two times a day (BID) | ORAL | 5 refills | Status: DC

## 2018-01-30 MED ORDER — CEPHALEXIN 500 MG PO CAPS: 500.0000 mg | ORAL_CAPSULE | Freq: Three times a day (TID) | ORAL | 0 refills | Status: AC

## 2018-01-30 MED ORDER — PIPERACILLIN-TAZOBACTAM 3.375 G IVPB 30 MIN
3.3750 g | Freq: Once | INTRAVENOUS | Status: AC
  Administered 2018-01-30: 3.375 g via INTRAVENOUS
  Filled 2018-01-30: qty 50

## 2018-01-30 MED ORDER — SULFAMETHOXAZOLE-TRIMETHOPRIM 800-160 MG PO TABS: 1.0000 | ORAL_TABLET | Freq: Two times a day (BID) | ORAL | 0 refills | Status: AC

## 2018-01-30 MED ORDER — LISINOPRIL 10 MG PO TABS: 10.0000 mg | ORAL_TABLET | Freq: Every day | ORAL | 5 refills | Status: DC

## 2018-01-30 MED ORDER — ACETAMINOPHEN 325 MG PO TABS: 650.0000 mg | ORAL_TABLET | Freq: Four times a day (QID) | ORAL | 1 refills | Status: AC | PRN

## 2018-01-30 NOTE — Progress Notes (Signed)
Pharmacy Antibiotic Note  Erik Rojas is a 47 y.o. male admitted on 01/27/2018 with scrotal swelling/abscess s/p I&D and possible Fournier's gangrene. Pharmacy has been consulted for vancomycin dosing. Zosyn has been ordered per EDP.  Plan: Zosyn 3.375g q8h EI per MD Vancomycin 1000mg  IV q8h Possible discharge home 01/30/18 with PO Bactrim and Keflex, outpatient urology follow-up  Vancomycin level as needed  Height: 5\' 5"  (165.1 cm) Weight: 240 lb 1.3 oz (108.9 kg) IBW/kg (Calculated) : 61.5  Temp (24hrs), Avg:98.5 F (36.9 C), Min:98.3 F (36.8 C), Max:98.8 F (37.1 C)  Recent Labs  Lab 01/27/18 1029 01/27/18 1233 01/27/18 1455 01/27/18 1724 01/27/18 1726 01/28/18 0715 01/30/18 0748  WBC 17.3*  --   --   --   --  14.8* 12.7*  CREATININE 1.03  --   --  1.06  --  0.92 0.77  LATICACIDVEN  --  3.48* 2.18*  --  1.9  --   --     Estimated Creatinine Clearance: 134.2 mL/min (by C-G formula based on SCr of 0.77 mg/dL).    No Known Allergies  Antimicrobials this admission: 6/13 Vanco >> 6/13 Zosyn >> 6/13 Fluconazole >>  Microbiology results: Blood Cx 6/13 > ngtd Urine Cx 6/14 > ng  Thank you for allowing pharmacy to be a part of this patient's care.   Leviathan Macera L. Kyung Rudd, PharmD, [place] PGY1 Pharmacy Resident

## 2018-01-30 NOTE — Progress Notes (Signed)
Erik Rojas to be D/C'd home with home health per MD order. Discussed with the patient and all questions fully answered. VVS, Skin clean, dry and intact without evidence of skin break down, no evidence of skin tears noted.  IV catheters discontinued intact. Site without signs and symptoms of complications. Dressing and pressure applied. Dressing to scrotum changed before discharge. An After Visit Summary and prescriptions were printed and given to the patient.  Patient escorted via [place], and D/C home via private auto.  [name]  01/30/2018 6:37 PM

## 2018-01-30 NOTE — Discharge Instructions (Signed)
May switch to ACE inhibitor (Lisinopril) for blood pressure control from amlodipine as outpatient once patient has completed Bactrim 1)scrotal wound dressing change daily x 1 week  ---Packed  Wound with iodoform gauze, then applied Telfa/Nonabsorbent material and then apply ABD/gauze or other absorbent material over this and then tape  2)Please do not start lisinopril (Blood pressure medicine) until you have completed Bactrim/sulfa antibiotic  3) low-salt and diabetic diet advised  4) follow-up with urologist within a week for recheck  5) follow up with diabetic educator and PCP for ongoing management of diabetes and blood pressure  6) follow-up with urologist for recheck within 1 week

## 2018-01-30 NOTE — Care Management Note (Signed)
Case Management Note  Patient Details  Name: Erik Rojas MRN: [id number] Date of Birth: 1971-05-28  Subjective/Objective:    Pt presents for scrotal abscess and new diagnosis of DM.  Pt to d/c home with [facility] RN for wound care and disease management.    Pt has no preference of [place] agencies and agreeable to [facility].            Action/Plan: Referral called to [facility] with [facility]. PCP will be Dr. Volanda Napoleon at [place], as per CM note.   Expected Discharge Date:  01/30/18               Expected Discharge Plan:  Home/Self Care  In-House Referral:  NA  Discharge planning Services  CM Consult  Post Acute Care Choice:  Home Health Choice offered to:  Patient  DME Arranged:  N/A DME Agency:  [place] Arranged:  RN [facility] Agency:  [place]  Status of Service:  Completed, signed off  If discussed at [place] of Stay Meetings, dates discussed:    Additional Comments:  Claudie Leach, RN 01/30/2018, 3:17 PM

## 2018-01-30 NOTE — Discharge Summary (Signed)
Erik Rojas, is a 47 y.o. male  DOB [birth year]  MRN [id number].  Admission date:  01/27/2018  Admitting Physician  Lady Deutscher, MD  Discharge Date:  01/30/2018   Primary MD  Patient, No Pcp Per  Recommendations for primary care physician for things to follow:   May switch to ACE inhibitor (Lisinopril) for blood pressure control from amlodipine as outpatient once patient has completed Bactrim 1)scrotal wound dressing change daily x 1 week  ---Packed  Wound with iodoform gauze, then applied Telfa/Nonabsorbent material and then apply ABD/gauze or other absorbent material over this and then tape  2)Please do not start lisinopril (Blood pressure medicine) until you have completed Bactrim/sulfa antibiotic  3) low-salt and diabetic diet advised  4) follow-up with urologist within a week for recheck  5) follow up with diabetic educator and PCP for ongoing management of diabetes and blood pressure  6) follow-up with urologist for recheck within 1 week   Admission Diagnosis  Pain [R52] Scrotal abscess [N49.2] Severe sepsis ([place]) [A41.9, R65.20]   Discharge Diagnosis  Pain [R52] Scrotal abscess [N49.2] Severe sepsis ([place]) [A41.9, R65.20]    Principal Problem:   Sepsis ([place]) Active Problems:   Scrotal abscess   New onset type 2 diabetes mellitus ([place])   HTN (hypertension)      Past Medical History:  Diagnosis Date  . HTN (hypertension)   . Scrotal abscess 01/28/2018   right  . Type II diabetes mellitus ([place]) 01/27/2018   "just dx'd" (01/28/2018)    Past Surgical History:  Procedure Laterality Date  . NO PAST SURGERIES       HPI  from the history and physical done on the day of admission:    Patient coming from/Resides with: Private residence  Chief Complaint: Scrotal pain and swelling  HPI: Erik Rojas is a 47 y.o. male with medical history significant  for untreated hypertension.  Patient reports 2 days of right testicular swelling followed by pain.  Denied knowledge of recent trauma or known colitis.  Does not shave this region of his body.  Has never had testicular swelling before.  He is not sexually active.  She had a low-grade fever 99.6.  Initial heart rate was 140 bpm and BP 151/106.  He was not hypoxemic.  Count was 17,300 differential not obtained.  Initial lactic acid 3.48 with decreased down to 2.18 midway through volume resuscitation.  Blood cultures were obtained in ER.  Scrotal ultrasound demonstrated echogenic foci shadowing throughout the right scrotal soft tissues concerning for gas.  Pelvic CT was obtained and revealed extensive soft tissue emphysema involving the right scrotum surrounding the testicle extending into the spermatic cord concerning for Fournier's gangrene.  Because of these findings EDP notified Urology/Dr. Alyson Ingles came to the bedside, examined the patient and reviewed the CT.  Urology felt current clinical exam more consistent with abscess and bedside I&D was done.  Patient has been given empiric Zosyn and vancomycin IV.  In addition to the above findings regarding the scrotal infection patient was  found to have a glucose of 463 with a mildly elevated anion gap of 17 consistent with likely new onset diabetes mellitus.  ED Course:  Vital Signs: BP (!) 143/90   Pulse (!) 113   Temp 99.6 F (37.6 C) (Oral)   Resp 16   Wt 99.8 kg (220 lb)   SpO2 94%  Scrotal ultrasound/CT pelvis: As above Lab data: Sodium 134, potassium 3.6, chloride 95, CO2 22, glucose 463, BUN 14, creatinine 1.03, calcium 8.7, anion gap 17, lactic acid 3.48 and follow-up 2.18, white count 17,300 differential not obtained, hemoglobin 17.5, platelets 327,000, blood cultures obtained in the ER; urinalysis and urine culture pending collection Medications and treatments: LR bolus x2 L, Zosyn 3.375 g IV x1, vancomycin 2 g IV x1, fentanyl 50 mcg IV x1     Hospital Course:    Brief summary 47 y.o.malewith medical history significant foruntreated hypertension admitted on 01/27/2018 with scrotal abscess, and new diagnosis of DM, underwent I&D of right scrotum by urology on 01/27/2018   Plan:- 1) scrotal abscess mostly on the right side-status post I&D, WBC trending down continue dressing change daily, treated with IV Vanco and Zosyn,  discharge home on 01/30/2018 on p.o. Bactrim and Keflex,  outpatient urology follow-up in a week   2) new onset DM--patient previously unaware of this diagnosis, hemoglobin A1c is 11.0,  discharge home on metformin 500 twice daily, diabetic educator input appreciated,  and discharge on insulin as above   3)HTN-discharge home on amlodipine, , , may switch to ACE inhibitor as outpatient once patient has completed Bactrim  Code Status : Full  Disposition Plan  : home   Consults  :  Urology/diabetic educator   DVT Prophylaxis  : SCDs    Discharge Condition: stable  Follow UP  [facility]. Go on 02/07/2018.   Specialty:  Family Medicine Why:  2:30 pm with Dr. Ocie Doyne information: [place] [phone]          Diet and Activity recommendation:  As advised  Discharge Instructions     Discharge Instructions    Amb Referral to Nutrition and Diabetic E   Complete by:  As directed    Call MD for:  difficulty breathing, headache or visual disturbances   Complete by:  As directed    Call MD for:  persistant dizziness or light-headedness   Complete by:  As directed    Call MD for:  persistant nausea and vomiting   Complete by:  As directed    Call MD for:  redness, tenderness, or signs of infection (pain, swelling, redness, odor or green/yellow discharge around incision site)   Complete by:  As directed    Call MD for:  severe uncontrolled pain   Complete by:  As directed    Call MD for:   temperature >100.4   Complete by:  As directed    Diet - low sodium heart healthy   Complete by:  As directed    Diet Carb Modified   Complete by:  As directed    Discharge instructions   Complete by:  As directed    1)scrotal wound dressing change daily x 1 week  ---Packed  Wound with iodoform gauze, then applied Telfa/Nonabsorbent material and then apply ABD/gauze or other absorbent material over this and then tape  2)Please do not start lisinopril (Blood pressure medicine) until you have completed Bactrim/sulfa antibiotic  3) low-salt and  diabetic diet advised  4) follow-up with urologist within a week for recheck  5) follow up with diabetic educator and PCP for ongoing management of diabetes and blood pressure  6) follow-up with urologist for recheck within 1 week   Increase activity slowly   Complete by:  As directed         Discharge Medications     Allergies as of 01/30/2018   No Known Allergies     Medication List    TAKE these medications   acetaminophen 325 MG tablet Commonly known as:  TYLENOL Take 2 tablets (650 mg total) by mouth every 6 (six) hours as needed for mild pain (or Fever >/= 101).   amLODipine 5 MG tablet Commonly known as:  NORVASC Take 1 tablet (5 mg total) by mouth daily. For BP Start taking on:  01/31/2018   blood glucose meter kit and supplies Relion Prime or Dispense other brand based on patient and insurance preference. Use up to four times daily as directed. (FOR ICD-9 250.00, 250.01).   cephALEXin 500 MG capsule Commonly known as:  KEFLEX Take 1 capsule (500 mg total) by mouth 3 (three) times daily for 7 days.   fluconazole 200 MG tablet Commonly known as:  DIFLUCAN Take 1 tablet (200 mg total) by mouth daily. Start taking on:  01/31/2018   insulin aspart 100 UNIT/ML FlexPen Commonly known as:  NOVOLOG Inject 3 Units into the skin 3 (three) times daily with meals. As long as Glucose is >90 pre-meal. Do NOT take if you do NOT  eat   Insulin Glargine 100 UNIT/ML Solostar Pen Commonly known as:  LANTUS Inject 25 Units into the skin daily at 10 pm. insulin   insulin starter kit- pen needles Misc 1 kit by Other route once for 1 dose. insulin pen needles (#[id number]),   lisinopril 10 MG tablet Commonly known as:  PRINIVIL,ZESTRIL Take 1 tablet (10 mg total) by mouth daily. For BP.... Start on 02/08/18 after you Complete Bactrim (Sulfa) antibiotic   metFORMIN 1000 MG tablet Commonly known as:  GLUCOPHAGE Take 1 tablet (1,000 mg total) by mouth 2 (two) times daily with a meal. For Diabetes   Pen Needles 3/16" 31G X 5 MM Misc Used to inject insulin with meals as advised- insulin pen needles (#[id number]),  Dx 250.02   sulfamethoxazole-trimethoprim 800-160 MG tablet Commonly known as:  BACTRIM DS,SEPTRA DS Take 1 tablet by mouth 2 (two) times daily for 7 days. antibiotic       Major procedures and Radiology Reports - PLEASE review detailed and final reports for all details, in brief    Ct Pelvis W Contrast  Result Date: 01/27/2018 CLINICAL DATA:  Testicular swelling and pain for 2 days. EXAM: CT PELVIS WITH CONTRAST TECHNIQUE: Multidetector CT imaging of the pelvis was performed using the standard protocol following the bolus administration of intravenous contrast. CONTRAST:  49m OMNIPAQUE IOHEXOL 300 MG/ML  SOLN COMPARISON:  None. FINDINGS: Urinary Tract:  No abnormality visualized. Bowel:  Unremarkable visualized pelvic bowel loops. Vascular/Lymphatic: No pathologically enlarged lymph nodes. No significant vascular abnormality seen. Reproductive: Prostate gland is unremarkable. Extensive soft tissue emphysema involving the right scrotum, surrounding the testicle and extending into the spermatic cord, most concerning for Fournier's gangrene. Other:  None. Musculoskeletal: No acute osseous abnormality. No aggressive osseous lesion. IMPRESSION: 1. Extensive soft tissue emphysema involving the right scrotum, surrounding the  testicle and extending into the spermatic cord, most concerning for Fournier's gangrene. Critical Value/emergent results were called by  telephone at the time of interpretation on 01/27/2018 at 1:07 pm to [facility] , who verbally acknowledged these results. Electronically Signed   By: Kathreen Devoid   On: 01/27/2018 13:07   US Scrotum W/doppler  Result Date: 01/27/2018 CLINICAL DATA:  Right greater than left scrotal swelling, pain EXAM: SCROTAL ULTRASOUND DOPPLER ULTRASOUND OF THE TESTICLES TECHNIQUE: Complete ultrasound examination of the testicles, epididymis, and other scrotal structures was performed. Color and spectral Doppler ultrasound were also utilized to evaluate blood flow to the testicles. COMPARISON:  None. FINDINGS: Right testicle Measurements: 4.5 x 2.3 x 3.6 cm. No mass or microlithiasis visualized. Left testicle Measurements: 4.9 x 1.9 x 3.2 cm. No mass or microlithiasis visualized. Right epididymis:  5 mm epididymal head cyst. Left epididymis:  Normal in size and appearance. Hydrocele:  None visualized. Varicocele:  None visualized. Pulsed Doppler interrogation of both testes demonstrates normal low resistance arterial and venous waveforms bilaterally. Other: Extensive echogenic foci are noted throughout the right scrotum with posterior acoustic shadowing. This is concerning for possible scrotal soft tissue gas. Cannot exclude Fournier's gangrene. Recommend further evaluation with he pelvic CT. IMPRESSION: Echogenic foci with shadowing noted throughout the right scrotal soft tissues concerning for gas. Cannot exclude Fournier's gangrene. Recommend further evaluation with pelvic CT. No testicular abnormality. These results were called by telephone at the time of interpretation on 01/27/2018 at 11:27 am to [facility] , who verbally acknowledged these results. Electronically Signed   By: Rolm Baptise M.D.   On: 01/27/2018 11:27    Micro Results    Recent Results (from the past 240  hour(s))  Blood Culture (routine x 2)     Status: None (Preliminary result)   Collection Time: 01/27/18 12:07 PM  Result Value Ref Range Status   Specimen Description BLOOD RIGHT ANTECUBITAL  Final   Special Requests   Final    BOTTLES DRAWN AEROBIC AND ANAEROBIC Blood Culture adequate volume   Culture   Final    NO GROWTH 3 DAYS Performed at [facility], [address], [place], [place] [address]    Report Status PENDING  Incomplete  Blood Culture (routine x 2)     Status: None (Preliminary result)   Collection Time: 01/27/18 12:12 PM  Result Value Ref Range Status   Specimen Description BLOOD BLOOD LEFT FOREARM  Final   Special Requests   Final    BOTTLES DRAWN AEROBIC AND ANAEROBIC Blood Culture adequate volume   Culture   Final    NO GROWTH 3 DAYS Performed at [facility], [place] [address] [place], [place] [address]    Report Status PENDING  Incomplete  MRSA PCR Screening     Status: None   Collection Time: 01/27/18  8:23 PM  Result Value Ref Range Status   MRSA by PCR NEGATIVE NEGATIVE Final    Comment:        The GeneXpert MRSA Assay (FDA approved for NASAL specimens only), is one component of a comprehensive MRSA colonization surveillance program. It is not intended to diagnose MRSA infection nor to guide or monitor treatment for MRSA infections. Performed at [facility], [place] [address], [place], [place] [address]   Urine culture     Status: None   Collection Time: 01/28/18  7:00 PM  Result Value Ref Range Status   Specimen Description URINE, CLEAN CATCH  Final   Special Requests NONE  Final   Culture   Final    NO GROWTH  Performed at [facility], [place] [address] [place], [place] [address]    Report Status 01/29/2018 FINAL  Final       Today   Subjective    Erik Rojas today has no complaints, no fever no chills no nausea no vomiting no diarrhea          Patient has been seen and examined prior to discharge     Objective   Blood pressure 132/83, pulse 83, temperature 99.2 F (37.3 C), temperature source Oral, resp. rate 17, height 5' 5"  (1.651 m), weight 108.9 kg (240 lb 1.3 oz), SpO2 94 %.   Intake/Output Summary (Last 24 hours) at 01/30/2018 1411 Last data filed at 01/29/2018 2326 Gross per 24 hour  Intake 280 ml  Output 600 ml  Net -320 ml    Exam  Gen:- Awake Alert,  In no apparent distress  HEENT:- .AT, No sclera icterus Neck-Supple Neck,No JVD,.  Lungs-  CTAB , good air movement CV- S1, S2 normal Abd-  +ve B.Sounds, Abd Soft, No tenderness,    Extremity/Skin:- No  edema, good pulses Psych-affect is appropriate, oriented x3 Neuro-no new focal deficits, no tremors GU-improved  scrotal erythema/swelling/induration/tenderness/warmth, I&D wound with iodoform gauze much less amount of serosanguineous drainage      Data Review   CBC w Diff:  Lab Results  Component Value Date   WBC 12.4 (H) 01/30/2018   HGB 13.9 01/30/2018   HCT 40.2 01/30/2018   PLT 362 01/30/2018    CMP:  Lab Results  Component Value Date   NA 138 01/30/2018   K 3.0 (L) 01/30/2018   CL 106 01/30/2018   CO2 23 01/30/2018   BUN 7 01/30/2018   CREATININE 0.76 01/30/2018   PROT 5.8 (L) 01/28/2018   ALBUMIN 2.2 (L) 01/28/2018   BILITOT 1.1 01/28/2018   ALKPHOS 93 01/28/2018   AST 17 01/28/2018   ALT 16 (L) 01/28/2018  .   Total Discharge time is about 33 minutes  Roxan Hockey M.D on 01/30/2018 at 2:11 PM  [facility]   Office  [phone]  Voice Recognition Vivian[employer] dictation system was used to create this note, attempts have been made to correct errors. Please contact the author with questions and/or clarifications.

## 2018-02-01 DIAGNOSIS — B3749 Other urogenital candidiasis: Secondary | ICD-10-CM | POA: Diagnosis not present

## 2018-02-01 DIAGNOSIS — B372 Candidiasis of skin and nail: Secondary | ICD-10-CM | POA: Diagnosis not present

## 2018-02-01 DIAGNOSIS — N492 Inflammatory disorders of scrotum: Secondary | ICD-10-CM | POA: Diagnosis not present

## 2018-02-01 LAB — CULTURE, BLOOD (ROUTINE X 2)
Culture: NO GROWTH
Culture: NO GROWTH
Special Requests: ADEQUATE
Special Requests: ADEQUATE

## 2018-02-07 ENCOUNTER — Encounter: Payer: Self-pay | Admitting: Family Medicine

## 2018-02-07 ENCOUNTER — Ambulatory Visit: Payer: 59 | Admitting: Family Medicine

## 2018-02-07 VITALS — BP 110/78 | HR 126 | Temp 98.6°F | Ht 67.0 in

## 2018-02-07 DIAGNOSIS — I10 Essential (primary) hypertension: Secondary | ICD-10-CM | POA: Diagnosis not present

## 2018-02-07 DIAGNOSIS — Z7689 Persons encountering health services in other specified circumstances: Secondary | ICD-10-CM | POA: Diagnosis not present

## 2018-02-07 DIAGNOSIS — N492 Inflammatory disorders of scrotum: Secondary | ICD-10-CM | POA: Diagnosis not present

## 2018-02-07 DIAGNOSIS — E119 Type 2 diabetes mellitus without complications: Secondary | ICD-10-CM | POA: Diagnosis not present

## 2018-02-07 LAB — CBC WITH DIFFERENTIAL/PLATELET
BASOS ABS: 0.2 10*3/uL — AB (ref 0.0–0.1)
Basophils Relative: 1.2 % (ref 0.0–3.0)
EOS PCT: 1.3 % (ref 0.0–5.0)
Eosinophils Absolute: 0.2 10*3/uL (ref 0.0–0.7)
HCT: 52.5 % — ABNORMAL HIGH (ref 39.0–52.0)
Hemoglobin: 18.4 g/dL (ref 13.0–17.0)
LYMPHS ABS: 2.5 10*3/uL (ref 0.7–4.0)
LYMPHS PCT: 17.4 % (ref 12.0–46.0)
MCHC: 35.1 g/dL (ref 30.0–36.0)
MCV: 88.6 fl (ref 78.0–100.0)
MONOS PCT: 4.2 % (ref 3.0–12.0)
Monocytes Absolute: 0.6 10*3/uL (ref 0.1–1.0)
NEUTROS ABS: 10.7 10*3/uL — AB (ref 1.4–7.7)
NEUTROS PCT: 75.9 % (ref 43.0–77.0)
PLATELETS: 824 10*3/uL — AB (ref 150.0–400.0)
RBC: 5.92 Mil/uL — ABNORMAL HIGH (ref 4.22–5.81)
RDW: 13.8 % (ref 11.5–15.5)
WBC: 14.1 10*3/uL — ABNORMAL HIGH (ref 4.0–10.5)

## 2018-02-07 LAB — BASIC METABOLIC PANEL
BUN: 17 mg/dL (ref 6–23)
CALCIUM: 10 mg/dL (ref 8.4–10.5)
CHLORIDE: 100 meq/L (ref 96–112)
CO2: 22 meq/L (ref 19–32)
CREATININE: 1.15 mg/dL (ref 0.40–1.50)
GFR: 73.21 mL/min (ref >60.00)
Glucose, Bld: 175 mg/dL — ABNORMAL HIGH (ref 70–99)
Potassium: 5.1 mEq/L (ref 3.5–5.1)
Sodium: 136 mEq/L (ref 135–145)

## 2018-02-07 NOTE — Patient Instructions (Signed)
How to Avoid Diabetes Mellitus Problems You can take action to prevent or slow down problems that are caused by diabetes (diabetes mellitus). Following your diabetes plan and taking care of yourself can reduce your risk of serious or life-threatening complications. Manage your diabetes  Follow instructions from your health care providers about managing your diabetes. Your diabetes may be managed by a team of health care providers who can teach you how to care for yourself and can answer questions that you have.  Educate yourself about your condition so you can make healthy choices about eating and physical activity.  Check your blood sugar (glucose) levels as often as directed. Your health care provider will help you decide how often to check your blood glucose level depending on your treatment goals and how well you are meeting them.  Ask your health care provider if you should take low-dose aspirin daily and what dose is recommended for you. Taking low-dose aspirin daily is recommended to help prevent cardiovascular disease. Do not use nicotine or tobacco Do not use any products that contain nicotine or tobacco, such as cigarettes and e-cigarettes. If you need help quitting, ask your health care provider. Nicotine raises your risk for diabetes problems. If you quit using nicotine:  You will lower your risk for heart attack, stroke, nerve disease, and kidney disease.  Your cholesterol and blood pressure may improve.  Your blood circulation will improve.  Keep your blood pressure under control To control your blood pressure:  Follow instructions from your health care provider about meal planning, exercise, and medicines.  Make sure your health care provider checks your blood pressure at every medical visit.  A blood pressure reading consists of two numbers. Generally, the goal is to keep your top number (systolic pressure) at or below 130, and your bottom number (diastolic pressure) at or  below 80. Your health care provider may recommend a lower target blood pressure. Your individualized target blood pressure is determined based on:  Your age.  Your medicines.  How long you have had diabetes.  Any other medical conditions you have.  Keep your cholesterol under control To control your cholesterol:  Follow instructions from your health care provider about meal planning, exercise, and medicines.  Have your cholesterol checked at least once a year.  You may be prescribed medicine to lower cholesterol (statin). If you are not taking a statin, ask your health care provider if you should be.  Controlling your cholesterol may:  Help prevent heart disease and stroke. These are the most common health problems for people with diabetes.  Improve your blood flow.  Schedule and keep yearly physical exams and eye exams Your health care provider will tell you how often you need medical visits depending on your diabetes management plan. Keep all follow-up visits as directed. This is important so possible problems can be identified early and complications can be avoided or treated.  Every visit with your health care provider should include measuring your: ? Weight. ? Blood pressure. ? Blood glucose control.  Your A1c (hemoglobin A1c) level should be checked: ? At least 2 times a year, if you are meeting your treatment goals. ? 4 times a year, if you are not meeting treatment goals or if your treatment goals have changed.  Your blood lipids (lipid profile) should be checked yearly. You should also be checked yearly for protein in your urine (urine microalbumin).  If you have type 1 diabetes, get an eye exam 3-5 years after you  are diagnosed, and then once a year after your first exam.  If you have type 2 diabetes, get an eye exam as soon as you are diagnosed, and then once a year after your first exam.  Keep your vaccines current It is recommended that you receive:  A flu  (influenza) vaccine every year.  A pneumonia (pneumococcal) vaccine and a hepatitis B vaccine. If you are age 6 or older, you may get the pneumonia vaccine as a series of two separate shots.  Ask your health care provider which other vaccines may be recommended. Take care of your feet Diabetes may cause you to have poor blood circulation to your legs and feet. Because of this, taking care of your feet is very important. Diabetes can cause:  The skin on the feet to get thinner, break more easily, and heal more slowly.  Nerve damage in your legs and feet, which results in decreased feeling. You may not notice minor injuries that could lead to serious problems.  To avoid foot problems:  Check your skin and feet every day for cuts, bruises, redness, blisters, or sores.  Schedule a foot exam with your health care provider once every year. This exam includes: ? Inspecting of the structure and skin of your feet. ? Checking the pulses and sensation in your feet.  Make sure that your health care provider performs a visual foot exam at every medical visit.  Take care of your teeth People with poorly controlled diabetes are more likely to have gum (periodontal) disease. Diabetes can make periodontal diseases harder to control. If not treated, periodontal diseases can lead to tooth loss. To prevent this:  Brush your teeth twice a day.  Floss at least once a day.  Visit your dentist 2 times a year.  Drink responsibly Limit alcohol intake to no more than 1 drink a day for nonpregnant women and 2 drinks a day for men. One drink equals 12 oz of beer, 5 oz of wine, or 1 oz of hard liquor. It is important to eat food when you drink alcohol to avoid low blood glucose (hypoglycemia). Avoid alcohol if you:  Have a history of alcohol abuse or dependence.  Are pregnant.  Have liver disease, pancreatitis, advanced neuropathy, or severe hypertriglyceridemia.  Lessen stress Living with diabetes can  be stressful. When you are experiencing stress, your blood glucose may be affected in two ways:  Stress hormones may cause your blood glucose to rise.  You may be distracted from taking good care of yourself.  Be aware of your stress level and make changes to help you manage challenging situations. To lower your stress levels:  Consider joining a support group.  Do planned relaxation or meditation.  Do a hobby that you enjoy.  Maintain healthy relationships.  Exercise regularly.  Work with your health care provider or a mental health professional.  Summary  You can take action to prevent or slow down problems that are caused by diabetes (diabetes mellitus). Following your diabetes plan and taking care of yourself can reduce your risk of serious or life-threatening complications.  Follow instructions from your health care providers about managing your diabetes. Your diabetes may be managed by a team of health care providers who can teach you how to care for yourself and can answer questions that you have.  Your health care provider will tell you how often you need medical visits depending on your diabetes management plan. Keep all follow-up visits as directed. This is important  so possible problems can be identified early and complications can be avoided or treated. This information is not intended to replace advice given to you by your health care provider. Make sure you discuss any questions you have with your health care provider. Document Released: 04/21/2011 Document Revised: 05/02/2016 Document Reviewed: 05/02/2016 Elsevier Interactive Patient Education  2018 [employer].  Insulin Injection Instructions, Using Insulin Pens, Adult A subcutaneous injection is a shot of medicine that is injected into the layer of fat between skin and muscle. People with type 1 diabetes must take insulin because their bodies do not make it. People with type 2 diabetes may need to take insulin.  There are many different types of insulin. The type of insulin that you take may determine how many injections you give yourself and when you need to take the injections. Choosing a site for injection Insulin absorption varies from site to site. It is best to inject insulin within the same body area, using a different spot in that area for each injection. Do not inject the insulin in the same spot for each injection. There are five main areas that can be used for injecting. These areas include:  Abdomen. This is the preferred area.  Front of thigh.  Upper, outer side of thigh.  Back of upper arm.  Buttocks.  Using an insulin pen First, follow the steps for Getting Ready, then continue with the steps for Injecting the Insulin. Getting Ready 1. Wash your hands with soap and water. If soap and water are not available, use hand sanitizer. 2. Check the expiration date and type of insulin in the pen. 3. If you are using CLEAR insulin, check to see that it is clear and free of clumps. 4. If you are using CLOUDY insulin, gently roll the pen between your palms several times, or tip the pen up and down several times to mix up the medicine. Do not shake the pen. 5. Remove the cap from the insulin pen. 6. Use an alcohol wipe to clean the rubber stopper of the pen cartridge. 7. Remove the protective paper tab from the disposable needle. Do not let the needle touch anything. 8. Screw the needle onto the pen. 9. Remove the outer and inner plastic covers from the needle. Do not throw away the outer plastic cover yet. 10. Prime the insulin pen by turning the button (dial) to 2 units. Hold the pen with the needle pointing up, and push the button on the opposite end of the pen until a drop of insulin appears at the needle tip. If no insulin appears, repeat this step. 11. Dial the number of units of insulin that you will be injecting. Injecting the Insulin  1. Use an alcohol wipe to clean the site where  you will be injecting the needle. Let the site air-dry. 2. Hold the pen in the palm of your writing hand with your thumb on the top. 3. If directed by your health care provider, use your other hand to pinch and hold about an inch of skin at the injection site. Do not directly touch the cleaned part of the skin. 4. Gently but quickly, put the needle straight into the skin. The needle should be at a 90-degree angle (perpendicular) to the skin, as if to form the letter "L." ? For example, if you are giving an injection in the abdomen, the abdomen forms one "leg" of the "L" and the needle forms the other "leg" of the "L." 5. For  adults who have a small amount of body fat, the needle may need to be injected at a 45-degree angle instead. Your health care provider will tell you if this is necessary. ? A 45-degree angle looks like the letter "V." 6. When the needle is completely inserted into the skin, use the thumb of your writing hand to push the top button of the pen down all the way to inject the insulin. 7. Let go of the skin that you are pinching. Continue to hold the pen in place with your writing hand. 8. Wait five seconds, then pull the needle straight out of the skin. 9. Carefully put the larger (outer) plastic cover of the needle back over the needle, then unscrew the capped needle and discard it in a sharps container, such as an empty plastic bottle with a cover. 10. Put the plastic cap back on the insulin pen. Throwing away supplies  Discard all used needles in a puncture-proof sharps disposal container. You can ask your local pharmacy about where you can get this kind of disposal container, or you can use an empty liquid laundry detergent bottle that has a cover.  Follow the disposal regulations for the area where you live. Do not use any needle more than one time.  Throw away empty disposable pens in the regular trash. What questions should I ask my health care provider?  How often should  I be taking insulin?  How often should I check my blood glucose?  What amount of insulin should I be taking at each time?  What are the side effects?  What should I do if my blood glucose is too high?  What should I do if my blood glucose is too low?  What should I do if I forget to take my insulin?  What number should I call if I have questions? Where can I get more information?  American Diabetes Association (ADA): www.diabetes.org  American Association of Diabetes Educators (AADE) Patient Resources: https://www.diabeteseducator.org/patient-resources This information is not intended to replace advice given to you by your health care provider. Make sure you discuss any questions you have with your health care provider. Document Released: 09/06/2015 Document Revised: 01/09/2016 Document Reviewed: 09/06/2015 Elsevier Interactive Patient Education  [employer].

## 2018-02-07 NOTE — Progress Notes (Signed)
Patient presents to clinic today to f/u on chronic issues and establish care.  SUBJECTIVE: PMH:  Pt is a 47 yo male with pmh sig for HTN, DM II.  Pt has not had a pcp.  Patient was recently hospitalized from 6/13-6/16/19 for 2/2 scrotal abscess. Since d/c pt has been taking Bactrim BID.  Pt has an appt with Urology tomorrow 02/08/18.  Pt was told he needed daily wound care/packing.  Pt states [facility] comes to pack his wound 3x/wk.  The other days, pt has to try to pack the wound.  Pt denies pain or increased redness, but notes stiffness in the muscles of his upper R thigh.    While in the hospital he was d/x'd with DM II.  Pt was given lantus 25 u qhs, novolog 3 units TID, and told to start Metformin after completing abx.  pt has been taking Metformin since d/c.  Pt has been checking fsbs daily, typically 83-217.  Pt denies hypoglycemia.  Pt states he was called about DM classes, but is afraid he cannot make them 2/2 work, so an appt with the nutritionist is to be scheduled.  HTN: pt endorses taking norvasc and lisinopril daily.  Per d/c summary, he was suppose to start lisinopril after completing abx course.  Allergies: NKDA  Social hx: Pt is single.  His family is from [place].  Pt states most of his family has a medical background.  Pt is a [place].  Pt denies tobacco or drug use.   Past Medical History:  Diagnosis Date  . HTN (hypertension)   . Scrotal abscess 01/28/2018   right  . Type II diabetes mellitus ([place]) 01/27/2018   "just dx'd" (01/28/2018)    Past Surgical History:  Procedure Laterality Date  . NO PAST SURGERIES      Current Outpatient Medications on File Prior to Visit  Medication Sig Dispense Refill  . acetaminophen (TYLENOL) 325 MG tablet Take 2 tablets (650 mg total) by mouth every 6 (six) hours as needed for mild pain (or Fever >/= 101). 30 tablet 1  . amLODipine (NORVASC) 5 MG tablet Take 1 tablet (5 mg total) by mouth daily. For BP 30 tablet  5  . blood glucose meter kit and supplies Relion Prime or Dispense other brand based on patient and insurance preference. Use up to four times daily as directed. (FOR ICD-9 250.00, 250.01). 1 each 3  . fluconazole (DIFLUCAN) 200 MG tablet Take 1 tablet (200 mg total) by mouth daily. 5 tablet 0  . insulin aspart (NOVOLOG) 100 UNIT/ML FlexPen Inject 3 Units into the skin 3 (three) times daily with meals. As long as Glucose is >90 pre-meal. Do NOT take if you do NOT eat 15 mL 5  . Insulin Glargine (LANTUS) 100 UNIT/ML Solostar Pen Inject 25 Units into the skin daily at 10 pm. insulin 15 mL 5  . Insulin Pen Needle (PEN NEEDLES 3/16") 31G X 5 MM MISC Used to inject insulin with meals as advised- insulin pen needles (#31594),  Dx 250.02 100 each 5  . lisinopril (PRINIVIL,ZESTRIL) 10 MG tablet Take 1 tablet (10 mg total) by mouth daily. For BP.... Start on 02/08/18 after you Complete Bactrim (Sulfa) antibiotic 30 tablet 5  . metFORMIN (GLUCOPHAGE) 1000 MG tablet Take 1 tablet (1,000 mg total) by mouth 2 (two) times daily with a meal. For Diabetes 60 tablet 5  . sulfamethoxazole-trimethoprim (BACTRIM DS,SEPTRA DS) 800-160 MG tablet Take 1 tablet by mouth 2 (two)  times daily.     No current facility-administered medications on file prior to visit.     No Known Allergies  Family History  Problem Relation Age of Onset  . Diabetes Mellitus II Mother   . Hypertension Mother   . Diabetes Mellitus II Brother     Social History   Socioeconomic History  . Marital status: Single    Spouse name: Not on file  . Number of children: Not on file  . Years of education: Not on file  . Highest education level: Not on file  Occupational History  . Not on file  Social Needs  . Financial resource strain: Not on file  . Food insecurity:    Worry: Not on file    Inability: Not on file  . Transportation needs:    Medical: Not on file    Non-medical: Not on file  Tobacco Use  . Smoking status: Never Smoker    . Smokeless tobacco: Never Used  Substance and Sexual Activity  . Alcohol use: Yes    Comment: 01/28/2018 "2 drinks/month; wine or whiskey"  . Drug use: Never  . Sexual activity: Not Currently  Lifestyle  . Physical activity:    Days per week: Not on file    Minutes per session: Not on file  . Stress: Not on file  Relationships  . Social connections:    Talks on phone: Not on file    Gets together: Not on file    Attends religious service: Not on file    Active member of club or organization: Not on file    Attends meetings of clubs or organizations: Not on file    Relationship status: Not on file  . Intimate partner violence:    Fear of current or ex partner: Not on file    Emotionally abused: Not on file    Physically abused: Not on file    Forced sexual activity: Not on file  Other Topics Concern  . Not on file  Social History Narrative  . Not on file    ROS General: Denies fever, chills, night sweats, changes in weight, changes in appetite HEENT: Denies headaches, ear pain, changes in vision, rhinorrhea, sore throat CV: Denies CP, palpitations, SOB, orthopnea Pulm: Denies SOB, cough, wheezing GI: Denies abdominal pain, nausea, vomiting, diarrhea, constipation GU: Denies dysuria, hematuria, frequency, vaginal discharge Msk: Denies muscle cramps, joint pains Neuro: Denies weakness, numbness, tingling Skin: Denies rashes, bruising  +scrotal abscess s/p I&D Psych: Denies depression, anxiety, hallucinations  BP 110/78 (BP Location: Left Arm, Patient Position: Sitting, Cuff Size: Normal)   Pulse (!) 126   Temp 98.6 F (37 C) (Oral)   Ht 5' 7"  (1.702 m)   SpO2 97%   BMI 37.60 kg/m   Physical Exam Gen. Pleasant, well developed, well-nourished, in NAD HEENT - [place]/AT, PERRL, no scleral icterus, no nasal drainage, pharynx without erythema or exudate. Lungs: no use of accessory muscles, CTAB, no wheezes, rales or rhonchi Cardiovascular: RRR, No r/g/m, no peripheral  edema Abdomen: BS present, soft, nontender, nondistended Musculoskeletal: No deformities, moves all four extremities, no cyanosis or clubbing, normal tone Neuro:  A&Ox3, CN II-XII intact, ambulating with a cane. GU:  Moderate scrotal edema R>L, no erythema, 1 cm incision with packing in place, large area of granulation tissue present adjacent to incision. No TTP.  Chaperone present, N. Kigotho, CNA.   Recent Results (from the past 2160 hour(s))  CBC     Status: Abnormal   Collection  Time: 01/27/18 10:29 AM  Result Value Ref Range   WBC 17.3 (H) 4.0 - 10.5 K/uL   RBC 5.64 4.22 - 5.81 MIL/uL   Hemoglobin 17.5 (H) 13.0 - 17.0 g/dL   HCT 48.5 39.0 - 52.0 %   MCV 86.0 78.0 - 100.0 fL   MCH 31.0 26.0 - 34.0 pg   MCHC 36.1 (H) 30.0 - 36.0 g/dL   RDW 12.2 11.5 - 15.5 %   Platelets 327 150 - 400 K/uL    Comment: Performed at [facility] [address] [place], [place] [address]  Basic metabolic panel     Status: Abnormal   Collection Time: 01/27/18 10:29 AM  Result Value Ref Range   Sodium 134 (L) 135 - 145 mmol/L   Potassium 3.6 3.5 - 5.1 mmol/L   Chloride 95 (L) 101 - 111 mmol/L   CO2 22 22 - 32 mmol/L   Glucose, Bld 463 (H) 65 - 99 mg/dL   BUN 14 6 - 20 mg/dL   Creatinine, Ser 1.03 0.61 - 1.24 mg/dL   Calcium 8.7 (L) 8.9 - 10.3 mg/dL   GFR calc non Af Amer >60 >60 mL/min   GFR calc Af Amer >60 >60 mL/min    Comment: (NOTE) The eGFR has been calculated using the CKD EPI equation. This calculation has not been validated in all clinical situations. eGFR's persistently <60 mL/min signify possible Chronic Kidney Disease.    Anion gap 17 (H) 5 - 15    Comment: Performed at [facility], [place] [address], [place], [place] [address]  Blood Culture (routine x 2)     Status: None   Collection Time: 01/27/18 12:07 PM  Result Value Ref Range   Specimen Description BLOOD RIGHT ANTECUBITAL    Special Requests      BOTTLES DRAWN AEROBIC AND ANAEROBIC Blood Culture adequate  volume   Culture      NO GROWTH 5 DAYS Performed at [facility], [place] [address] [place], [place] [address]    Report Status 02/01/2018 FINAL   Blood Culture (routine x 2)     Status: None   Collection Time: 01/27/18 12:12 PM  Result Value Ref Range   Specimen Description BLOOD BLOOD LEFT FOREARM    Special Requests      BOTTLES DRAWN AEROBIC AND ANAEROBIC Blood Culture adequate volume   Culture      NO GROWTH 5 DAYS Performed at [facility], [place] [address] [place],  [address]    Report Status 02/01/2018 FINAL   I-Stat CG4 Lactic Acid, ED  (not at  [facility])     Status: Abnormal   Collection Time: 01/27/18 12:33 PM  Result Value Ref Range   Lactic Acid, Venous 3.48 (HH) 0.5 - 1.9 mmol/L   Comment NOTIFIED PHYSICIAN   I-Stat CG4 Lactic Acid, ED  (not at  [facility])     Status: Abnormal   Collection Time: 01/27/18  2:55 PM  Result Value Ref Range   Lactic Acid, Venous 2.18 (HH) 0.5 - 1.9 mmol/L   Comment NOTIFIED PHYSICIAN   Basic metabolic panel     Status: Abnormal   Collection Time: 01/27/18  5:24 PM  Result Value Ref Range   Sodium 134 (L) 135 - 145 mmol/L   Potassium 3.6 3.5 - 5.1 mmol/L   Chloride 98 (L) 101 - 111 mmol/L   CO2 23 22 - 32 mmol/L   Glucose, Bld 361 (H) 65 - 99  mg/dL   BUN 13 6 - 20 mg/dL   Creatinine, Ser 1.06 0.61 - 1.24 mg/dL   Calcium 7.9 (L) 8.9 - 10.3 mg/dL   GFR calc non Af Amer >60 >60 mL/min   GFR calc Af Amer >60 >60 mL/min    Comment: (NOTE) The eGFR has been calculated using the CKD EPI equation. This calculation has not been validated in all clinical situations. eGFR's persistently <60 mL/min signify possible Chronic Kidney Disease.    Anion gap 13 5 - 15    Comment: Performed at [facility] [address] [place], [place] [address]  HIV antibody (Routine Testing)     Status: None   Collection Time: 01/27/18  5:24 PM  Result Value Ref Range   HIV Screen 4th Generation wRfx Non Reactive Non Reactive    Comment:  (NOTE) Performed At: [facility] [place], [place] [id number] Rush Farmer MD [id number] Performed at [facility], [place] [address] [place], [place] [address]   Magnesium     Status: None   Collection Time: 01/27/18  5:24 PM  Result Value Ref Range   Magnesium 1.9 1.7 - 2.4 mg/dL    Comment: Performed at [facility], [place] [address], [place], [place] [address]  Phosphorus     Status: None   Collection Time: 01/27/18  5:24 PM  Result Value Ref Range   Phosphorus 4.2 2.5 - 4.6 mg/dL    Comment: Performed at [facility], [place] [address], [place], [place] [address]  Hemoglobin A1c     Status: Abnormal   Collection Time: 01/27/18  5:25 PM  Result Value Ref Range   Hgb A1c MFr Bld 11.0 (H) 4.8 - 5.6 %    Comment: (NOTE) Pre diabetes:          5.7%-6.4% Diabetes:              >6.4% Glycemic control for   <7.0% adults with diabetes    Mean Plasma Glucose 269 mg/dL    Comment: Performed at [facility] [address], [place], [place] [address]  Lactic acid, plasma     Status: None   Collection Time: 01/27/18  5:26 PM  Result Value Ref Range   Lactic Acid, Venous 1.9 0.5 - 1.9 mmol/L    Comment: Performed at [facility] [address], [place], [place] [address]  CBG monitoring, ED     Status: Abnormal   Collection Time: 01/27/18  6:25 PM  Result Value Ref Range   Glucose-Capillary 346 (H) 65 - 99 mg/dL  MRSA PCR Screening     Status: None   Collection Time: 01/27/18  8:23 PM  Result Value Ref Range   MRSA by PCR NEGATIVE NEGATIVE    Comment:        The GeneXpert MRSA Assay (FDA approved for NASAL specimens only), is one component of a comprehensive MRSA colonization surveillance program. It is not intended to diagnose MRSA infection nor to guide or monitor treatment for MRSA infections. Performed at [facility], [place] [address], [place], [place] [address]   Glucose, capillary     Status: Abnormal    Collection Time: 01/27/18  8:23 PM  Result Value Ref Range   Glucose-Capillary 370 (H) 65 - 99 mg/dL  Glucose, capillary     Status: Abnormal   Collection Time: 01/27/18 10:41 PM  Result Value Ref Range   Glucose-Capillary 321 (H)  65 - 99 mg/dL  Glucose, capillary     Status: Abnormal   Collection Time: 01/28/18  3:53 AM  Result Value Ref Range   Glucose-Capillary 255 (H) 65 - 99 mg/dL  Comprehensive metabolic panel     Status: Abnormal   Collection Time: 01/28/18  7:15 AM  Result Value Ref Range   Sodium 134 (L) 135 - 145 mmol/L   Potassium 3.3 (L) 3.5 - 5.1 mmol/L   Chloride 99 (L) 101 - 111 mmol/L   CO2 27 22 - 32 mmol/L   Glucose, Bld 295 (H) 65 - 99 mg/dL   BUN 11 6 - 20 mg/dL   Creatinine, Ser 0.92 0.61 - 1.24 mg/dL   Calcium 7.7 (L) 8.9 - 10.3 mg/dL   Total Protein 5.8 (L) 6.5 - 8.1 g/dL   Albumin 2.2 (L) 3.5 - 5.0 g/dL   AST 17 15 - 41 U/L   ALT 16 (L) 17 - 63 U/L   Alkaline Phosphatase 93 38 - 126 U/L   Total Bilirubin 1.1 0.3 - 1.2 mg/dL   GFR calc non Af Amer >60 >60 mL/min   GFR calc Af Amer >60 >60 mL/min    Comment: (NOTE) The eGFR has been calculated using the CKD EPI equation. This calculation has not been validated in all clinical situations. eGFR's persistently <60 mL/min signify possible Chronic Kidney Disease.    Anion gap 8 5 - 15    Comment: Performed at [facility] [address], [place], [place] [address]  CBC     Status: Abnormal   Collection Time: 01/28/18  7:15 AM  Result Value Ref Range   WBC 14.8 (H) 4.0 - 10.5 K/uL   RBC 4.43 4.22 - 5.81 MIL/uL   Hemoglobin 13.7 13.0 - 17.0 g/dL    Comment: REPEATED TO VERIFY   HCT 38.3 (L) 39.0 - 52.0 %   MCV 86.5 78.0 - 100.0 fL   MCH 30.9 26.0 - 34.0 pg   MCHC 35.8 30.0 - 36.0 g/dL   RDW 12.6 11.5 - 15.5 %   Platelets 313 150 - 400 K/uL    Comment: Performed at [facility], [place] [address] [place], [place] [address]  Glucose, capillary     Status: Abnormal   Collection Time:  01/28/18  7:51 AM  Result Value Ref Range   Glucose-Capillary 275 (H) 65 - 99 mg/dL   Comment 1 Document in Chart   Glucose, capillary     Status: Abnormal   Collection Time: 01/28/18 12:20 PM  Result Value Ref Range   Glucose-Capillary 342 (H) 65 - 99 mg/dL   Comment 1 Document in Chart   Glucose, capillary     Status: Abnormal   Collection Time: 01/28/18  5:06 PM  Result Value Ref Range   Glucose-Capillary 255 (H) 65 - 99 mg/dL  Urinalysis, Routine w reflex microscopic     Status: Abnormal   Collection Time: 01/28/18  7:00 PM  Result Value Ref Range   Color, Urine YELLOW YELLOW   APPearance CLEAR CLEAR   Specific Gravity, Urine 1.036 (H) 1.005 - 1.030   pH 5.0 5.0 - 8.0   Glucose, UA >=500 (A) NEGATIVE mg/dL   Hgb urine dipstick NEGATIVE NEGATIVE   Bilirubin Urine NEGATIVE NEGATIVE   Ketones, ur 5 (A) NEGATIVE mg/dL   Protein, ur NEGATIVE NEGATIVE mg/dL   Nitrite NEGATIVE NEGATIVE   Leukocytes, UA NEGATIVE NEGATIVE   RBC / HPF 0-5 0 - 5 RBC/hpf  WBC, UA 0-5 0 - 5 WBC/hpf   Bacteria, UA NONE SEEN NONE SEEN   Squamous Epithelial / LPF 0-5 0 - 5    Comment: Performed at [facility], [place] [address] [place], [place] [address]  Urine culture     Status: None   Collection Time: 01/28/18  7:00 PM  Result Value Ref Range   Specimen Description URINE, CLEAN CATCH    Special Requests NONE    Culture      NO GROWTH Performed at [facility], [place] [address], [place], [place] [address]    Report Status 01/29/2018 FINAL   Glucose, capillary     Status: Abnormal   Collection Time: 01/28/18  8:33 PM  Result Value Ref Range   Glucose-Capillary 227 (H) 65 - 99 mg/dL  Glucose, capillary     Status: Abnormal   Collection Time: 01/29/18 12:30 AM  Result Value Ref Range   Glucose-Capillary 166 (H) 65 - 99 mg/dL  Glucose, capillary     Status: Abnormal   Collection Time: 01/29/18  4:09 AM  Result Value Ref Range   Glucose-Capillary 187 (H) 65 - 99 mg/dL  Glucose,  capillary     Status: Abnormal   Collection Time: 01/29/18  8:04 AM  Result Value Ref Range   Glucose-Capillary 182 (H) 65 - 99 mg/dL  Glucose, capillary     Status: Abnormal   Collection Time: 01/29/18 12:25 PM  Result Value Ref Range   Glucose-Capillary 217 (H) 65 - 99 mg/dL  Glucose, capillary     Status: Abnormal   Collection Time: 01/29/18  4:11 PM  Result Value Ref Range   Glucose-Capillary 219 (H) 65 - 99 mg/dL  Glucose, capillary     Status: Abnormal   Collection Time: 01/29/18  8:23 PM  Result Value Ref Range   Glucose-Capillary 227 (H) 65 - 99 mg/dL   Comment 1 Notify RN    Comment 2 Document in Chart   Glucose, capillary     Status: Abnormal   Collection Time: 01/29/18 11:23 PM  Result Value Ref Range   Glucose-Capillary 199 (H) 65 - 99 mg/dL  Glucose, capillary     Status: Abnormal   Collection Time: 01/30/18  3:49 AM  Result Value Ref Range   Glucose-Capillary 144 (H) 65 - 99 mg/dL  CBC     Status: Abnormal   Collection Time: 01/30/18  7:48 AM  Result Value Ref Range   WBC 12.7 (H) 4.0 - 10.5 K/uL   RBC 4.43 4.22 - 5.81 MIL/uL   Hemoglobin 13.2 13.0 - 17.0 g/dL   HCT 38.4 (L) 39.0 - 52.0 %   MCV 86.7 78.0 - 100.0 fL   MCH 29.8 26.0 - 34.0 pg   MCHC 34.4 30.0 - 36.0 g/dL   RDW 12.6 11.5 - 15.5 %   Platelets 355 150 - 400 K/uL    Comment: Performed at [facility], [place] [address], [place], [place] [address]  Basic metabolic panel     Status: Abnormal   Collection Time: 01/30/18  7:48 AM  Result Value Ref Range   Sodium 138 135 - 145 mmol/L   Potassium 3.3 (L) 3.5 - 5.1 mmol/L   Chloride 106 101 - 111 mmol/L   CO2 23 22 - 32 mmol/L   Glucose, Bld 138 (H) 65 - 99 mg/dL   BUN 6 6 - 20 mg/dL   Creatinine, Ser 0.77 0.61 - 1.24 mg/dL   Calcium 7.5 (  L) 8.9 - 10.3 mg/dL   GFR calc non Af Amer >60 >60 mL/min   GFR calc Af Amer >60 >60 mL/min    Comment: (NOTE) The eGFR has been calculated using the CKD EPI equation. This calculation has not been validated  in all clinical situations. eGFR's persistently <60 mL/min signify possible Chronic Kidney Disease.    Anion gap 9 5 - 15    Comment: Performed at [facility] [address] [place], [place] [address]  Glucose, capillary     Status: Abnormal   Collection Time: 01/30/18  8:18 AM  Result Value Ref Range   Glucose-Capillary 132 (H) 65 - 99 mg/dL  Basic metabolic panel     Status: Abnormal   Collection Time: 01/30/18  9:04 AM  Result Value Ref Range   Sodium 138 135 - 145 mmol/L   Potassium 3.0 (L) 3.5 - 5.1 mmol/L   Chloride 106 101 - 111 mmol/L   CO2 23 22 - 32 mmol/L   Glucose, Bld 139 (H) 65 - 99 mg/dL   BUN 7 6 - 20 mg/dL   Creatinine, Ser 0.76 0.61 - 1.24 mg/dL   Calcium 7.6 (L) 8.9 - 10.3 mg/dL   GFR calc non Af Amer >60 >60 mL/min   GFR calc Af Amer >60 >60 mL/min    Comment: (NOTE) The eGFR has been calculated using the CKD EPI equation. This calculation has not been validated in all clinical situations. eGFR's persistently <60 mL/min signify possible Chronic Kidney Disease.    Anion gap 9 5 - 15    Comment: Performed at [facility] [address], [place], [place] [address]  CBC     Status: Abnormal   Collection Time: 01/30/18  9:04 AM  Result Value Ref Range   WBC 12.4 (H) 4.0 - 10.5 K/uL   RBC 4.57 4.22 - 5.81 MIL/uL   Hemoglobin 13.9 13.0 - 17.0 g/dL   HCT 40.2 39.0 - 52.0 %   MCV 88.0 78.0 - 100.0 fL   MCH 30.4 26.0 - 34.0 pg   MCHC 34.6 30.0 - 36.0 g/dL   RDW 12.7 11.5 - 15.5 %   Platelets 362 150 - 400 K/uL    Comment: Performed at [facility], [place] [address], [place], [place] [address]  Glucose, capillary     Status: Abnormal   Collection Time: 01/30/18 11:54 AM  Result Value Ref Range   Glucose-Capillary 217 (H) 65 - 99 mg/dL  Glucose, capillary     Status: Abnormal   Collection Time: 01/30/18  2:58 PM  Result Value Ref Range   Glucose-Capillary 259 (H) 65 - 99 mg/dL  Glucose, capillary     Status: Abnormal   Collection Time:  01/30/18  5:01 PM  Result Value Ref Range   Glucose-Capillary 254 (H) 65 - 99 mg/dL    Assessment/Plan: Scrotal abscess  -complete abx course (bactrim) -continue keeping area clean and dry -continue regular packing -scrotal elevation encouraged to reduce edema -keep appt with Urology tomorrow - Plan: CBC with Differential/Platelet, Basic metabolic panel  New onset type 2 diabetes mellitus ([place]) -discussed diagnosis -continue lantus 25 units qhs, novolog 3 units TID with meals, and metformin 1000 mg BID.  Anticipate will need medication adjustments -pt education given.  Handouts given. -pt to call clinic with name of his meter so rx for strips can be sent to pharmacy. -will also send in refills for novolog and lantus. -pt encouraged to touch base  with nutritionist in regards to scheduling appt.  Essential hypertension  -continue norvasc 5 mg and lisinopril 10 mg daily -pt encouraged to stay hydrated. -pt to start checking bp daily and keeping a log. - Plan: Basic metabolic panel  Encounter to establish care -We reviewed the PMH, PSH, FH, SH, Meds and Allergies. -We provided refills for any medications we will prescribe as needed. -We addressed current concerns per orders and patient instructions. -We have asked for records for pertinent exams, studies, vaccines and notes from previous providers. -We have advised patient to follow up per instructions below.  F/u in 1 month for DM, sooner if needed  Grier Mitts, MD

## 2018-02-08 DIAGNOSIS — N499 Inflammatory disorder of unspecified male genital organ: Secondary | ICD-10-CM | POA: Diagnosis not present

## 2018-02-09 ENCOUNTER — Telehealth: Payer: Self-pay | Admitting: General Practice

## 2018-02-09 ENCOUNTER — Other Ambulatory Visit: Payer: Self-pay | Admitting: Family Medicine

## 2018-02-09 DIAGNOSIS — N492 Inflammatory disorders of scrotum: Secondary | ICD-10-CM | POA: Diagnosis not present

## 2018-02-09 DIAGNOSIS — B3749 Other urogenital candidiasis: Secondary | ICD-10-CM | POA: Diagnosis not present

## 2018-02-09 DIAGNOSIS — D473 Essential (hemorrhagic) thrombocythemia: Secondary | ICD-10-CM

## 2018-02-09 DIAGNOSIS — D751 Secondary polycythemia: Secondary | ICD-10-CM

## 2018-02-09 DIAGNOSIS — D75839 Thrombocytosis, unspecified: Secondary | ICD-10-CM

## 2018-02-09 DIAGNOSIS — B372 Candidiasis of skin and nail: Secondary | ICD-10-CM | POA: Diagnosis not present

## 2018-02-09 NOTE — Telephone Encounter (Signed)
ok 

## 2018-02-09 NOTE — Telephone Encounter (Signed)
Copied from [place] [id number]. Topic: Inquiry >> Feb 09, 2018 10:16 AM Mylinda Latina, NT wrote: Reason for CRM: Milbert Coulter calling from [facility] and states he needs verbal orders on the patient .The orders are for wound care and diabetic teaching   The orders are RN nursing care 4x a week for 1 week , 2x a week for 1 week . Please call to provider this verbal . CB# [phone]

## 2018-02-09 NOTE — Telephone Encounter (Signed)
Please Advise if ok to give verbal orders 

## 2018-02-10 NOTE — Telephone Encounter (Signed)
Spoke with Milbert Coulter from [facility] in regards to pt verbal order request, stated that they will not need to extend care since pt was better and ready to work.

## 2018-02-11 DIAGNOSIS — N492 Inflammatory disorders of scrotum: Secondary | ICD-10-CM | POA: Diagnosis not present

## 2018-02-15 ENCOUNTER — Ambulatory Visit: Payer: 59 | Admitting: *Deleted

## 2018-02-25 ENCOUNTER — Other Ambulatory Visit: Payer: Self-pay

## 2018-02-25 ENCOUNTER — Ambulatory Visit: Payer: 59 | Admitting: Family Medicine

## 2018-02-25 ENCOUNTER — Encounter: Payer: Self-pay | Admitting: Family Medicine

## 2018-02-25 ENCOUNTER — Other Ambulatory Visit: Payer: Self-pay | Admitting: Family Medicine

## 2018-02-25 VITALS — BP 120/80 | HR 98 | Temp 98.3°F | Wt 221.0 lb

## 2018-02-25 DIAGNOSIS — I10 Essential (primary) hypertension: Secondary | ICD-10-CM

## 2018-02-25 DIAGNOSIS — N492 Inflammatory disorders of scrotum: Secondary | ICD-10-CM | POA: Diagnosis not present

## 2018-02-25 DIAGNOSIS — E119 Type 2 diabetes mellitus without complications: Secondary | ICD-10-CM

## 2018-02-25 MED ORDER — "PEN NEEDLES 3/16"" 31G X 5 MM MISC": 5 refills | Status: DC

## 2018-02-25 MED ORDER — ONETOUCH ULTRASOFT LANCETS MISC: 5 refills | Status: AC

## 2018-02-25 MED ORDER — INSULIN GLARGINE 100 UNIT/ML SOLOSTAR PEN: 25.0000 [IU] | PEN_INJECTOR | Freq: Every day | SUBCUTANEOUS | 5 refills | Status: DC

## 2018-02-25 MED ORDER — GLUCOSE BLOOD VI STRP: ORAL_STRIP | 12 refills | Status: AC

## 2018-02-25 NOTE — Progress Notes (Signed)
Subjective:    Patient ID: Erik Rojas, male    DOB: 03-Apr-1971, 46 y.o.   MRN: [id number]  No chief complaint on file.   HPI Patient was seen today for f/u.    HTN: -pt stopped taking lisinopril 2 days ago. Felt like it was making him dizzy.  Even tried taking it at night -still taking norvasc 5 mg daily -checking bp at home.  Forgot log  DM: -hgb A1C 11.0 % on 01/27/18 -taking metformin 1000 mg BID, lantus 25 units and humalog 3 unit TID -fsbs ranges from 90-169. -pt using mysugr app to track fsbs.  Scrotal abscess: -pt had f/u with urology.  Next appt in 1 month -pt still packing wound -denies fever, chills, n/v -feels better than he did at last OFV. -no longer using a cane to ambulate.  Past Medical History:  Diagnosis Date  . HTN (hypertension)   . Scrotal abscess 01/28/2018   right  . Type II diabetes mellitus ([place]) 01/27/2018   "just dx'd" (01/28/2018)    No Known Allergies  ROS General: Denies fever, chills, night sweats, changes in weight, changes in appetite HEENT: Denies headaches, ear pain, changes in vision, rhinorrhea, sore throat CV: Denies CP, palpitations, SOB, orthopnea Pulm: Denies SOB, cough, wheezing GI: Denies abdominal pain, nausea, vomiting, diarrhea, constipation GU: Denies dysuria, hematuria, frequency, vaginal discharge  +scrotal discomfort d/t scrotal abscess. Msk: Denies muscle cramps, joint pains Neuro: Denies weakness, numbness, tingling Skin: Denies rashes, bruising Psych: Denies depression, anxiety, hallucinations     Objective:    Blood pressure 120/80, pulse 98, temperature 98.3 F (36.8 C), temperature source Oral, weight 221 lb (100.2 kg), SpO2 97 %.   Gen. Pleasant, well-nourished, in no distress, normal affect   Lungs: no accessory muscle use Cardiovascular: RRR, no m/r/g, no peripheral edema Abdomen: BS present, soft, NT/ND Neuro:  A&Ox3, CN II-XII intact, slow but normal gait GU: mild scrotal edema  improved from previous visit, 1/4 inch packing in place, mildly malodorous, no erythema or drainage noted.  Skin near incision healthy in appearance.  Wt Readings from Last 3 Encounters:  02/25/18 221 lb (100.2 kg)  01/30/18 240 lb 1.3 oz (108.9 kg)    Lab Results  Component Value Date   WBC 14.1 (H) 02/07/2018   HGB 18.4 Repeated and verified X2. (HH) 02/07/2018   HCT 52.5 (H) 02/07/2018   PLT 824.0 (H) 02/07/2018   GLUCOSE 175 (H) 02/07/2018   ALT 16 (L) 01/28/2018   AST 17 01/28/2018   NA 136 02/07/2018   K 5.1 02/07/2018   CL 100 02/07/2018   CREATININE 1.15 02/07/2018   BUN 17 02/07/2018   CO2 22 02/07/2018   HGBA1C 11.0 (H) 01/27/2018    Assessment/Plan:  Scrotal abscess -improving -continue packing wound -monitor for s/s of infection -keep f/u with urology. -given RTC or ED precautions.  Essential hypertension -controlled -will d/c lisinopril at this time.  Discuss trying a different ACEI or ARB for renal protection given h/o DM. -for now continue norvasc 5 mg daily -pt to continue check bp and keeping a log -continue lifestyle modifications  New onset type 2 diabetes mellitus (HCC) -continue lantus 25 units qhs, novolog 3 units TID with meals and metformin 1000 mg BID -continue lifestyle modifications -encouraged to continue fsbs log  F/u prn in the next month  Grier Mitts, MD

## 2018-02-25 NOTE — Patient Instructions (Signed)
Diabetes Mellitus and Exercise Exercising regularly is important for your overall health, especially when you have diabetes (diabetes mellitus). Exercising is not only about losing weight. It has many health benefits, such as increasing muscle strength and bone density and reducing body fat and stress. This leads to improved fitness, flexibility, and endurance, all of which result in better overall health. Exercise has additional benefits for people with diabetes, including:  Reducing appetite.  Helping to lower and control blood glucose.  Lowering blood pressure.  Helping to control amounts of fatty substances (lipids) in the blood, such as cholesterol and triglycerides.  Helping the body to respond better to insulin (improving insulin sensitivity).  Reducing how much insulin the body needs.  Decreasing the risk for heart disease by: ? Lowering cholesterol and triglyceride levels. ? Increasing the levels of good cholesterol. ? Lowering blood glucose levels.  What is my activity plan? Your health care provider or certified diabetes educator can help you make a plan for the type and frequency of exercise (activity plan) that works for you. Make sure that you:  Do at least 150 minutes of moderate-intensity or vigorous-intensity exercise each week. This could be brisk walking, biking, or water aerobics. ? Do stretching and strength exercises, such as yoga or weightlifting, at least 2 times a week. ? Spread out your activity over at least 3 days of the week.  Get some form of physical activity every day. ? Do not go more than 2 days in a row without some kind of physical activity. ? Avoid being inactive for more than 90 minutes at a time. Take frequent breaks to walk or stretch.  Choose a type of exercise or activity that you enjoy, and set realistic goals.  Start slowly, and gradually increase the intensity of your exercise over time.  What do I need to know about managing my  diabetes?  Check your blood glucose before and after exercising. ? If your blood glucose is higher than 240 mg/dL (13.3 mmol/L) before you exercise, check your urine for ketones. If you have ketones in your urine, do not exercise until your blood glucose returns to normal.  Know the symptoms of low blood glucose (hypoglycemia) and how to treat it. Your risk for hypoglycemia increases during and after exercise. Common symptoms of hypoglycemia can include: ? Hunger. ? Anxiety. ? Sweating and feeling clammy. ? Confusion. ? Dizziness or feeling light-headed. ? Increased heart rate or palpitations. ? Blurry vision. ? Tingling or numbness around the mouth, lips, or tongue. ? Tremors or shakes. ? Irritability.  Keep a rapid-acting carbohydrate snack available before, during, and after exercise to help prevent or treat hypoglycemia.  Avoid injecting insulin into areas of the body that are going to be exercised. For example, avoid injecting insulin into: ? The arms, when playing tennis. ? The legs, when jogging.  Keep records of your exercise habits. Doing this can help you and your health care provider adjust your diabetes management plan as needed. Write down: ? Food that you eat before and after you exercise. ? Blood glucose levels before and after you exercise. ? The type and amount of exercise you have done. ? When your insulin is expected to peak, if you use insulin. Avoid exercising at times when your insulin is peaking.  When you start a new exercise or activity, work with your health care provider to make sure the activity is safe for you, and to adjust your insulin, medicines, or food intake as needed.    Drink plenty of water while you exercise to prevent dehydration or heat stroke. Drink enough fluid to keep your urine clear or pale yellow. This information is not intended to replace advice given to you by your health care provider. Make sure you discuss any questions you have with  your health care provider. Document Released: 10/24/2003 Document Revised: 02/21/2016 Document Reviewed: 01/13/2016 Elsevier Interactive Patient Education  2018 [employer].  Diabetes Mellitus and Sick Day Management Blood sugar (glucose) can be difficult to control when you are sick. Common illnesses that can cause problems for people with diabetes (diabetes mellitus) include colds, fever, flu (influenza), nausea, vomiting, and diarrhea. These illnesses can cause stress and loss of body fluids (dehydration), and those issues can cause blood glucose levels to increase. Because of this, it is very important to take your insulin and diabetes medicines and eat some form of carbohydrate when you are sick. You should make a plan for days when you are sick (sick day plan) as part of your diabetes management plan. You and your health care provider should make this plan in advance. The following guidelines are intended to help you manage an illness that lasts for about 24 hours or less. Your health care provider may also give you more specific instructions. What do I need to do to manage my blood glucose?  Check your blood glucose every 2-4 hours, or as often as told by your health care provider.  Know your sick day treatment goals. Your target blood glucose levels may be different when you are sick.  If you use insulin, take your usual dose. ? If your blood glucose continues to be too high, you may need to take an additional insulin dose as told by your health care provider.  If you use oral diabetes medicine, you may need to stop taking it if you are not able to eat or drink normally. Ask your health care provider about whether you need to stop taking these medicines while you are sick.  If you use injectable hormone medicines other than insulin to control your diabetes, ask your health care provider about whether you need to stop taking these medicines while you are sick. What else can I do to manage  my diabetes when I am sick? Check your ketones  If you have type 1 diabetes, check your urine ketones every 4 hours.  If you have type 2 diabetes, check your urine ketones as often as told by your health care provider. Drink fluids  Drink enough fluid to keep your urine clear or pale yellow. This is especially important if you have a fever, vomiting, or diarrhea. Those symptoms can lead to dehydration.  Follow any instructions from your health care provider about beverages to avoid. ? Do not drink alcohol, caffeine, or drinks that contain a lot of sugar. Take medicines as directed  Take-over-the-counter and prescription medicines only as told by your health care provider.  Check medicine labels for added sugars. Some medicines may contain sugar or types of sugars that can raise your blood glucose level. What foods can I eat when I am sick? You need to eat some form of carbohydrates when you are sick. You should eat 45-50 grams (45-50 g) of carbohydrates every 3-4 hours until you feel better. All of the food choices below contain about 15 g of carbohydrates. Plan ahead and keep some of these foods around so you have them if you get sick.  4-6 oz (120-177 mL) carbonated beverage that  contains sugar, such as regular (not diet) soda. You may be able to drink carbonated beverages more easily if you open the beverage and let it sit at room temperature for a few minutes before drinking.   of a twin frozen ice pop.  4 oz (120 g) regular gelatin.  4 oz (120 mL) fruit juice.  4 oz (120 g) ice cream or frozen yogurt.  2 oz (60 g) sherbet.  8 oz (240 mL) clear broth or soup.  4 oz (120 g) regular custard.  4 oz (120 g) regular pudding.  8 oz (240 g) plain yogurt.  1 slice bread or toast.  6 saltine crackers.  5 vanilla wafers.  Questions to ask your health care provider Consider asking the following questions so you know what to do on days when you are sick:  Should I adjust my  diabetes medicines?  How often do I need to check my blood glucose?  What supplies do I need to manage my diabetes at home when I am sick?  What number can I call if I have questions?  What foods and drinks should I avoid?  Contact a health care provider if:  You develop symptoms of diabetic ketoacidosis, such as: ? Fatigue. ? Weight loss. ? Excessive thirst. ? Light-headedness. ? Fruity or sweet-smelling breath. ? Excessive urination. ? Vision changes. ? Confusion or irritability. ? Nausea. ? Vomiting. ? Rapid breathing. ? Pain in the abdomen. ? Feeling flushed.  You are unable to drink fluids without vomiting.  You have any of the following for more than 6 hours: ? Nausea. ? Vomiting. ? Diarrhea.  Your blood glucose is at or above 240 mg/dL (13.3 mmol/L), even after you take an additional insulin dose.  You have a change in how you think, feel, or act (mental status).  You develop another serious illness.  You have been sick or have had a fever for 2 days or longer and you are not getting better. Get help right away if:  Your blood glucose is lower than 54 mg/dL (3.0 mmol/L).  You have difficulty breathing.  You have moderate or high ketone levels in your urine.  You used emergency glucagon to treat low blood glucose. Summary  Blood sugar (glucose) can be difficult to control when you are sick. Common illnesses that can cause problems for people with diabetes (diabetes mellitus) include colds, fever, flu (influenza), nausea, vomiting, and diarrhea.  Illnesses can cause stress and loss of body fluids (dehydration), and those issues can cause blood glucose levels to increase.  Make a plan for days when you are sick (sick day plan) as part of your diabetes management plan. You and your health care provider should make this plan in advance.  It is very important to take your insulin and diabetes medicines and to eat some form of carbohydrate when you are  sick.  Contact your health care provider if have problems managing your blood glucose levels when you are sick, or if you have been sick or had a fever for 2 days or longer and are not getting better. This information is not intended to replace advice given to you by your health care provider. Make sure you discuss any questions you have with your health care provider. Document Released: 08/06/2003 Document Revised: 05/01/2016 Document Reviewed: 05/01/2016 Elsevier Interactive Patient Education  2018 [employer].  Wound Packing Wound packing involves placing a moistened packing material into your wound and then covering it with an outer bandage (  dressing). This helps promote proper healing of deep tissue and tissue under the skin. It also helps prevent bleeding, infection, and further injury. Wounds are packed until deep tissue has had time to heal. The time it takes for this to occur is different for everyone. Your health care provider will show you how to pack and dress your wound. Using gloves and a sterile technique is important in order to avoid spreading germs into your wound. What are the risks?  Infection.  Delayed or abnormal healing. How to pack your wound Follow your health care provider's instructions on how often you need to change dressings and pack your wound. You will likely be asked to change dressings 1-2 times a day. Supplies Needed  Gloves.  Wetting solution.  Clean bowl.  Packing material (gauze or gauze sponges).  Clean towels.  Outer dressing.  Tape.  Cotton balls or cotton-tipped swabs.  Small plastic bag. Preparing Your New Packing Material  1. Make sure your work surface or countertop is clean and disinfected. 2. Wash your hands well with soap and water. 3. Put a clean towel on the counter. 4. Put a clean bowl on the towel. Be sure to only touch the outside of the bowl when handling it. 5. Pour wetting solution into the bowl. 6. Cut your packing  material (gauze or sponges) to the right size for your wound. Drop it into the bowl. 7. Cut four tape strips that you will use to seal the outer dressing. 8. Put cotton balls or cotton-tipped swabs on the clean towel. Removing the Old Packing and Dressing 1. Gently remove the old dressing and packing material. 2. Put the removed items into the plastic bag to throw away later. 3. Wash your hands well with soap and water again. Applying New Packing Material and Dressing 1. Put on your gloves. 2. Squeeze the packing material in the bowl to release excess liquid. The packing material should be moist, but not dripping wet. 3. Gently place the packing material into the wound. Use a cotton ball or cotton-tipped swab to guide it into place, filling all of the space. 4. Dry your fingertips on the towel. 5. Open up your outer dressing supplies and put them on a dry part of the towel. Keep them from getting wet. 6. Place the dressing over the packed wound. 7. Tape the four outer edges of the dressing in place. 8. Remove your gloves. 9. Wash your hands again with soap and water. General Tips  Follow your health care provider's instructions on how tightly to pack the wound. At first, the wound will be packed tightly to help stop bleeding. As the wound begins to heal inside, you will use less packing material and pack the wound loosely to allow tissue to heal slowly from the inside out.  Keep the dressing clean and dry.  Follow any other instructions given by your health care provider on how to aid healing. This may include applying warm or cold compresses, elevating the affected area, or wearing a compression dressing.  Ask your health care provider about sun exposure and sunscreen when the dressings are no longer needed.  Keep all follow-up visits with your health care provider. This is important. Contact a health care provider if:  You have drainage, redness, swelling, or pain at your wound  site.  You notice a bad smell coming from the wound site.  Your pain is not controlled with pain medicine.  Tissue inside your wound changes color from pink  to white, yellow, or black.  Your wound changes in size or depth.  You have a fever.  You have shaking chills.  You are having trouble packing your wound. This information is not intended to replace advice given to you by your health care provider. Make sure you discuss any questions you have with your health care provider. Document Released: 02/28/2014 Document Revised: 02/21/2016 Document Reviewed: 09/27/2013 Elsevier Interactive Patient Education  [employer].

## 2018-03-09 ENCOUNTER — Encounter: Payer: 59 | Attending: Family Medicine | Admitting: *Deleted

## 2018-03-09 DIAGNOSIS — E119 Type 2 diabetes mellitus without complications: Secondary | ICD-10-CM | POA: Diagnosis not present

## 2018-03-09 DIAGNOSIS — Z713 Dietary counseling and surveillance: Secondary | ICD-10-CM | POA: Diagnosis not present

## 2018-03-09 NOTE — Patient Instructions (Signed)
Plan:  Aim for 3 Carb Choices per meal (45 grams) +/- 1 either way  Aim for 0-2 Carbs per snack if hungry  Include protein in moderation with your meals and snacks Consider reading food labels for Total Carbohydrate of foods When healed from your procedure, consider  increasing your activity level daily as tolerated Consider checking BG at alternate times per day  I support you looking into the [employer] CGM  Continue taking medication as directed by MD Consider carrying some candy or other fast acting carb food in case you have a low blood sugar reaction

## 2018-03-11 DIAGNOSIS — N499 Inflammatory disorder of unspecified male genital organ: Secondary | ICD-10-CM | POA: Diagnosis not present

## 2018-03-11 NOTE — Progress Notes (Signed)
Diabetes Self-Management Education  Visit Type: First/Initial  Appt. Start Time: 1400 Appt. End Time: 9147  03/11/2018  [name], identified by name and date of birth, is a 47 y.o. male with a diagnosis of Diabetes: Type 2. Patient was just diagnosed last month when he was in hospital for an infection and surgical procedure. He works in [employer] and travels [place] the [place] for his job. He states he is comfortable with taking his insulin and uses a pen for both of them. He states he tests 3 times a day before each meal and his current average BG is running around 130 mg/dl. He has seen information on the Libre CGM and would like more information about that.  ASSESSMENT  Height 5\' 5"  (1.651 m), weight 224 lb 12.8 oz (102 kg). Body mass index is 37.41 kg/m.  Diabetes Self-Management Education - 03/09/18 1411      Visit Information   Visit Type  First/Initial      Initial Visit   Diabetes Type  Type 2    Are you currently following a meal plan?  No    Are you taking your medications as prescribed?  Yes    Date Diagnosed  01/2018      Health Coping   How would you rate your overall health?  Fair      Psychosocial Assessment   Patient Belief/Attitude about Diabetes  Motivated to manage diabetes    Other persons present  Patient    Patient Concerns  Nutrition/Meal planning;Medication;Monitoring;Glycemic Control    Special Needs  None    How often do you need to have someone help you when you read instructions, pamphlets, or other written materials from your doctor or pharmacy?  1 - Never    What is the last grade level you completed in school?  college degree      Pre-Education Assessment   Patient understands the diabetes disease and treatment process.  Needs Instruction    Patient understands incorporating nutritional management into lifestyle.  Needs Instruction    Patient undertands incorporating physical activity into lifestyle.  Needs Instruction    Patient understands using medications safely.  Needs Review    Patient understands monitoring blood glucose, interpreting and using results  Needs Review    Patient understands prevention, detection, and treatment of acute complications.  Needs Instruction    Patient understands prevention, detection, and treatment of chronic complications.  Needs Instruction    Patient understands how to develop strategies to address psychosocial issues.  Needs Instruction    Patient understands how to develop strategies to promote health/change behavior.  Needs Instruction      Complications   Last HgB A1C per patient/outside source  7 %    How often do you check your blood sugar?  3-4 times/day    Fasting Blood glucose range (mg/dL)  70-129;130-179    Postprandial Blood glucose range (mg/dL)  70-129;130-179    Number of hypoglycemic episodes per month  0    Have you had a dilated eye exam in the past 12 months?  Yes    Have you had a dental exam in the past 12 months?  Yes    Are you checking your feet?  Yes    How many days per week are you checking your feet?  5      Dietary Intake   Breakfast  used to skip, now eats PNB sandwich OR eggs with vegetables OR low carb cereal with milk  Snack (morning)  not unless peanuts occasionally    Lunch  buys salads with veggies and protein (chicken or fish), occasionally with piece of bread    Snack (afternoon)  fresh fruit OR 1/2 PNB sandwich    Dinner  meat, starch and vegetables along with salad    Snack (evening)  occasionally coffee with crackers OR 1/2 PNB sandwich    Beverage(s)  tea, water, Sparkling Ice 0 calorie water, tea with Stevia      Exercise   Exercise Type  ADL's surgery 1 month ago    How many days per week to you exercise?  0    How many minutes per day do you exercise?  0    Total minutes per week of exercise  0      Patient Education   Previous Diabetes Education  No    Disease state   Definition of diabetes, type 1 and 2, and the  diagnosis of diabetes    Nutrition management   Role of diet in the treatment of diabetes and the relationship between the three main macronutrients and blood glucose level;Carbohydrate counting;Food label reading, portion sizes and measuring food.;Reviewed blood glucose goals for pre and post meals and how to evaluate the patients' food intake on their blood glucose level.    Physical activity and exercise   Role of exercise on diabetes management, blood pressure control and cardiac health.    Medications  Reviewed patients medication for diabetes, action, purpose, timing of dose and side effects.    Monitoring  Purpose and frequency of SMBG.;Identified appropriate SMBG and/or A1C goals.    Acute complications  Taught treatment of hypoglycemia - the 15 rule.    Chronic complications  Relationship between chronic complications and blood glucose control    Psychosocial adjustment  Role of stress on diabetes      Individualized Goals (developed by patient)   Nutrition  Follow meal plan discussed    Medications  take my medication as prescribed    Monitoring   test blood glucose pre and post meals as discussed      Post-Education Assessment   Patient understands the diabetes disease and treatment process.  Demonstrates understanding / competency    Patient understands incorporating nutritional management into lifestyle.  Demonstrates understanding / competency    Patient undertands incorporating physical activity into lifestyle.  Demonstrates understanding / competency    Patient understands using medications safely.  Demonstrates understanding / competency    Patient understands monitoring blood glucose, interpreting and using results  Demonstrates understanding / competency    Patient understands prevention, detection, and treatment of acute complications.  Demonstrates understanding / competency    Patient understands prevention, detection, and treatment of chronic complications.  Demonstrates  understanding / competency    Patient understands how to develop strategies to address psychosocial issues.  Demonstrates understanding / competency      Outcomes   Expected Outcomes  Demonstrated interest in learning. Expect positive outcomes    Future DMSE  PRN    Program Status  Completed       Individualized Plan for Diabetes Self-Management Training:   Learning Objective:  Patient will have a greater understanding of diabetes self-management. Patient education plan is to attend individual and/or group sessions per assessed needs and concerns.   Plan:   Patient Instructions  Plan:  Aim for 3 Carb Choices per meal (45 grams) +/- 1 either way  Aim for 0-2 Carbs per snack if hungry  Include  protein in moderation with your meals and snacks Consider reading food labels for Total Carbohydrate of foods When healed from your procedure, consider  increasing your activity level daily as tolerated Consider checking BG at alternate times per day  I support you looking into the Libre CGM  Continue taking medication as directed by MD Consider carrying some candy or other fast acting carb food in case you have a low blood sugar reaction  Expected Outcomes:  Demonstrated interest in learning. Expect positive outcomes  Education material provided: ADA Diabetes: Your Take Control Guide, A1C conversion sheet, Meal plan card and Carbohydrate counting sheet, Insulin Action handout, [employer], CGM Comparison Sheet  If problems or questions, patient to contact team via:  Phone  Future DSME appointment: PRN

## 2018-03-14 ENCOUNTER — Telehealth: Payer: Self-pay | Admitting: Family Medicine

## 2018-03-14 NOTE — Telephone Encounter (Signed)
Copied from [place] [id number]. Topic: Quick Communication - See Telephone Encounter >> Mar 14, 2018  3:34 PM Synthia Innocent wrote: CRM for notification. See Telephone encounter for: 03/14/18. Patient is requesting freestyle libre[facility] [address] Requesting 2 sensors first month and 1 thereafter.  Please advise

## 2018-03-15 NOTE — Telephone Encounter (Signed)
This is a new glucometer for this patient. Can you approve this in PCP absence? He has new onset T2DM dx in June 2019. Thanks.

## 2018-03-15 NOTE — Telephone Encounter (Signed)
Called patient and left detailed message (per DPR) w/ instructions to contact his insurance company regarding coverage of this device and let us know if it is covered.

## 2018-03-15 NOTE — Telephone Encounter (Signed)
OK for her to get- IF insurance will cover.   We have had some difficulty getting many insurance companies to cover.

## 2018-03-16 NOTE — Telephone Encounter (Signed)
Pt calling back stating that his insurance want cover any meter unless he has type 1 DM please give him a call back at [phone]

## 2018-03-17 MED ORDER — FREESTYLE LIBRE 14 DAY SENSOR MISC: 1.0000 | Freq: Every day | 0 refills | Status: DC

## 2018-03-17 MED ORDER — FREESTYLE LIBRE 14 DAY READER DEVI: 1.0000 | Freq: Every day | 0 refills | Status: DC

## 2018-03-17 NOTE — Telephone Encounter (Signed)
Returned call to patient for clarification. He states he spoke w/ his insurance company several times and each time received a different answer about how/if they would cover a glucometer. Most recently he was told they would not cover anything unless he had T1DM. Given this, patient would like to proceed w/ Freestyle Libre and will pay OOP if needed.   Called pharmacy to verify if this could be ordered as a kit/system (we have an option for this in Epic), or if the reader/sensor needed to be ordered separately. Pharmacy stated they would need to be ordered separately. Medication filled to pharmacy as requested.

## 2018-03-22 NOTE — Telephone Encounter (Signed)
Called pt to follow up if he did receive the diabetic supplies, left a message to call the office if needs further assistant.

## 2018-03-23 NOTE — Telephone Encounter (Signed)
Patient called in.  States that he is doing well and will just meet with physician at next [place].

## 2018-03-24 DIAGNOSIS — N499 Inflammatory disorder of unspecified male genital organ: Secondary | ICD-10-CM | POA: Diagnosis not present

## 2018-03-24 NOTE — Telephone Encounter (Signed)
Will send to Dr. Banks as FYI 

## 2018-03-29 ENCOUNTER — Telehealth: Payer: Self-pay | Admitting: Family Medicine

## 2018-03-29 NOTE — Telephone Encounter (Signed)
Pt requesting the Freestyle Libre 14 day sensor.  [facility] [place] [place]  PCP:  Dr. Volanda Napoleon

## 2018-03-29 NOTE — Telephone Encounter (Signed)
Copied from [place] [id number]. Topic: Quick Communication - Rx Refill/Question >> Mar 29, 2018  2:55 PM Mcneil, Ja-Kwan wrote: Medication: Continuous Blood Gluc Sensor (FREESTYLE LIBRE 14 DAY SENSOR) MISC  Has the patient contacted their pharmacy? No   Preferred Pharmacy (with phone number or street name): [id number] [address], [place] [phone] (Phone) [phone] (Fax)   Agent: Please be advised that RX refills may take up to 3 business days. We ask that you follow-up with your pharmacy.

## 2018-03-31 NOTE — Telephone Encounter (Signed)
Spoke with pt pharmacy provided the cost of the [place] device, pt was made aware stated that he will pay the out of pocket cost since he needs the device whenever he travels. Rx was was sent to the pharmacy

## 2018-04-06 ENCOUNTER — Ambulatory Visit: Payer: 59 | Admitting: Family Medicine

## 2018-04-06 ENCOUNTER — Encounter: Payer: Self-pay | Admitting: Family Medicine

## 2018-04-06 VITALS — BP 100/86 | HR 74 | Temp 97.9°F | Wt 223.0 lb

## 2018-04-06 DIAGNOSIS — E119 Type 2 diabetes mellitus without complications: Secondary | ICD-10-CM

## 2018-04-06 DIAGNOSIS — Z794 Long term (current) use of insulin: Secondary | ICD-10-CM

## 2018-04-06 MED ORDER — FREESTYLE LIBRE 14 DAY SENSOR MISC: 1.0000 | 11 refills | Status: DC

## 2018-04-06 NOTE — Progress Notes (Signed)
Subjective:    Patient ID: Erik Rojas, male    DOB: 05-29-1971, 47 y.o.   MRN: [id number]  No chief complaint on file.   HPI Patient was seen today for follow-up on diabetes.  Pt states he is enjoying the freestyle libre meter.  It makes it more convenient for him to monitor his blood sugars more closely.  Patient denies hypoglycemia.  Highest blood sugars have been 150.  Pt states his visit with the nutritionist was helpful.  Pt states the real test is coming up when he travels to [place] for his job.  Past Medical History:  Diagnosis Date  . HTN (hypertension)   . Scrotal abscess 01/28/2018   right  . Type II diabetes mellitus ([facility]) 01/27/2018   "just dx'd" (01/28/2018)    No Known Allergies  ROS General: Denies fever, chills, night sweats, changes in weight, changes in appetite HEENT: Denies headaches, ear pain, changes in vision, rhinorrhea, sore throat CV: Denies CP, palpitations, SOB, orthopnea Pulm: Denies SOB, cough, wheezing GI: Denies abdominal pain, nausea, vomiting, diarrhea, constipation GU: Denies dysuria, hematuria, frequency, vaginal discharge Msk: Denies muscle cramps, joint pains Neuro: Denies weakness, numbness, tingling Skin: Denies rashes, bruising Psych: Denies depression, anxiety, hallucinations     Objective:    Blood pressure 100/86, pulse 74, temperature 97.9 F (36.6 C), temperature source Oral, weight 223 lb (101.2 kg), SpO2 98 %.   Gen. Pleasant, well-nourished, in no distress, normal affect   HEENT: [place]/AT, face symmetric, no scleral icterus, PERRLA, nares patent without drainage Lungs: no accessory muscle use, CTAB, no wheezes or rales Cardiovascular: RRR, no m/r/g, no peripheral edema Neuro:  A&Ox3, CN II-XII intact, normal gait Skin:  Warm, no lesions/ rash   Wt Readings from Last 3 Encounters:  04/06/18 223 lb (101.2 kg)  03/09/18 224 lb 12.8 oz (102 kg)  02/25/18 221 lb (100.2 kg)    Lab Results  Component Value Date    WBC 14.1 (H) 02/07/2018   HGB 18.4 Repeated and verified X2. (HH) 02/07/2018   HCT 52.5 (H) 02/07/2018   PLT 824.0 (H) 02/07/2018   GLUCOSE 175 (H) 02/07/2018   ALT 16 (L) 01/28/2018   AST 17 01/28/2018   NA 136 02/07/2018   K 5.1 02/07/2018   CL 100 02/07/2018   CREATININE 1.15 02/07/2018   BUN 17 02/07/2018   CO2 22 02/07/2018   HGBA1C 11.0 (H) 01/27/2018    Assessment/Plan:  Type 2 diabetes mellitus without complication, with long-term current use of insulin ([place])  - Plan: Continuous Blood Gluc Sensor (FREESTYLE LIBRE 14 DAY SENSOR) MISC, DISCONTINUED: Continuous Blood Gluc Sensor (FREESTYLE LIBRE 14 DAY SENSOR) MISC  Follow-up in 1 to 2 months for DM.  Will recheck hemoglobin A1c at that time, urine microalbumin/creatinine, and foot exam.   Grier Mitts, MD

## 2018-05-03 DIAGNOSIS — R102 Pelvic and perineal pain: Secondary | ICD-10-CM | POA: Diagnosis not present

## 2018-05-04 ENCOUNTER — Encounter: Payer: Self-pay | Admitting: Family Medicine

## 2018-05-04 ENCOUNTER — Ambulatory Visit: Payer: 59 | Admitting: Family Medicine

## 2018-05-04 VITALS — BP 98/72 | HR 152 | Temp 98.5°F | Wt 217.0 lb

## 2018-05-04 DIAGNOSIS — L03116 Cellulitis of left lower limb: Secondary | ICD-10-CM

## 2018-05-04 DIAGNOSIS — E119 Type 2 diabetes mellitus without complications: Secondary | ICD-10-CM

## 2018-05-04 NOTE — Progress Notes (Signed)
Subjective:    Patient ID: Erik Rojas, male    DOB: Jan 22, 1971, 47 y.o.   MRN: [id number]  No chief complaint on file.   HPI Patient was seen today for follow-up on diabetes.  Pt states he has been doing well overall.  Pt states freestyle libre meter makes his life easier.  Pt using NovoLog 3 units 3 times daily, Lantus 25 units nightly, metformin 1000 mg twice daily.  Pt notes slight increase in FSBS (130s) with recent infection.  Typically less than 120s.  Pt states he is nervous.  He was seen by urology for f/u on scrotal abscess.  Of note pt endorses rash on left upper thigh x 2 days.  He was started on antibiotics by urology and told to stop his lisinopril while taking them.  Pt cannot recall the name of antibiotic, possibly Bactrim.  Pt denies chills, nausea, vomiting, itching.  States temp was 100 yesterday, but rash has improved today.  Past Medical History:  Diagnosis Date  . HTN (hypertension)   . Scrotal abscess 01/28/2018   right  . Type II diabetes mellitus ([place]) 01/27/2018   "just dx'd" (01/28/2018)    No Known Allergies  ROS General: Denies fever, chills, night sweats, changes in weight, changes in appetite HEENT: Denies headaches, ear pain, changes in vision, rhinorrhea, sore throat CV: Denies CP, palpitations, SOB, orthopnea Pulm: Denies SOB, cough, wheezing GI: Denies abdominal pain, nausea, vomiting, diarrhea, constipation GU: Denies dysuria, hematuria, frequency, vaginal discharge Msk: Denies muscle cramps, joint pains Neuro: Denies weakness, numbness, tingling Skin: Denies bruising  +rash Psych: Denies depression, anxiety, hallucinations     Objective:    Blood pressure 98/72, pulse (!) 152, temperature 98.5 F (36.9 C), temperature source Oral, weight 217 lb (98.4 kg), SpO2 97 %.   Gen. Pleasant, well-nourished, in no distress, normal affect   HEENT: [place]/AT, face symmetric, no scleral icterus, PERRLA, nares patent without drainage. Lungs:  no accessory muscle use, CTAB, no wheezes or rales Cardiovascular: Tachycardia, no peripheral edema Neuro:  A&Ox3, CN II-XII intact, normal gait Skin: Left upper thigh erythematous, with warmth, no induration, no pustule or drainage noted.  Wt Readings from Last 3 Encounters:  05/04/18 217 lb (98.4 kg)  04/06/18 223 lb (101.2 kg)  03/09/18 224 lb 12.8 oz (102 kg)    Lab Results  Component Value Date   WBC 14.1 (H) 02/07/2018   HGB 18.4 Repeated and verified X2. (HH) 02/07/2018   HCT 52.5 (H) 02/07/2018   PLT 824.0 (H) 02/07/2018   GLUCOSE 175 (H) 02/07/2018   ALT 16 (L) 01/28/2018   AST 17 01/28/2018   NA 136 02/07/2018   K 5.1 02/07/2018   CL 100 02/07/2018   CREATININE 1.15 02/07/2018   BUN 17 02/07/2018   CO2 22 02/07/2018   HGBA1C 11.0 (H) 01/27/2018    Assessment/Plan:  New onset type 2 diabetes mellitus ([facility]) -Continue Lantus 25 units nightly, NovoLog 3 units 3 times daily with meals, metformin 1000 mg twice daily -Continue using freestyle libre and intermittently checking FSBS.  Cellulitis of left lower extremity -pt advised to continue abx given by Urology. -pt given RTC or ED precautions for worsening symptoms including fever, chills, worsening rash, fatigue, nausea, vomiting, etc. -Infection/nervousness likely causing tachycardia.  P.o. fluids encouraged -We will recheck pulse -given handout  Grier Mitts, MD

## 2018-05-04 NOTE — Patient Instructions (Signed)
Diabetes Mellitus and Sick Day Management Blood sugar (glucose) can be difficult to control when you are sick. Common illnesses that can cause problems for people with diabetes (diabetes mellitus) include colds, fever, flu (influenza), nausea, vomiting, and diarrhea. These illnesses can cause stress and loss of body fluids (dehydration), and those issues can cause blood glucose levels to increase. Because of this, it is very important to take your insulin and diabetes medicines and eat some form of carbohydrate when you are sick. You should make a plan for days when you are sick (sick day plan) as part of your diabetes management plan. You and your health care provider should make this plan in advance. The following guidelines are intended to help you manage an illness that lasts for about 24 hours or less. Your health care provider may also give you more specific instructions. What do I need to do to manage my blood glucose?  Check your blood glucose every 2-4 hours, or as often as told by your health care provider.  Know your sick day treatment goals. Your target blood glucose levels may be different when you are sick.  If you use insulin, take your usual dose. ? If your blood glucose continues to be too high, you may need to take an additional insulin dose as told by your health care provider.  If you use oral diabetes medicine, you may need to stop taking it if you are not able to eat or drink normally. Ask your health care provider about whether you need to stop taking these medicines while you are sick.  If you use injectable hormone medicines other than insulin to control your diabetes, ask your health care provider about whether you need to stop taking these medicines while you are sick. What else can I do to manage my diabetes when I am sick? Check your ketones  If you have type 1 diabetes, check your urine ketones every 4 hours.  If you have type 2 diabetes, check your urine ketones as  often as told by your health care provider. Drink fluids  Drink enough fluid to keep your urine clear or pale yellow. This is especially important if you have a fever, vomiting, or diarrhea. Those symptoms can lead to dehydration.  Follow any instructions from your health care provider about beverages to avoid. ? Do not drink alcohol, caffeine, or drinks that contain a lot of sugar. Take medicines as directed  Take-over-the-counter and prescription medicines only as told by your health care provider.  Check medicine labels for added sugars. Some medicines may contain sugar or types of sugars that can raise your blood glucose level. What foods can I eat when I am sick? You need to eat some form of carbohydrates when you are sick. You should eat 45-50 grams (45-50 g) of carbohydrates every 3-4 hours until you feel better. All of the food choices below contain about 15 g of carbohydrates. Plan ahead and keep some of these foods around so you have them if you get sick.  4-6 oz (120-177 mL) carbonated beverage that contains sugar, such as regular (not diet) soda. You may be able to drink carbonated beverages more easily if you open the beverage and let it sit at room temperature for a few minutes before drinking.   of a twin frozen ice pop.  4 oz (120 g) regular gelatin.  4 oz (120 mL) fruit juice.  4 oz (120 g) ice cream or frozen yogurt.  2 oz (60  g) sherbet.  8 oz (240 mL) clear broth or soup.  4 oz (120 g) regular custard.  4 oz (120 g) regular pudding.  8 oz (240 g) plain yogurt.  1 slice bread or toast.  6 saltine crackers.  5 vanilla wafers.  Questions to ask your health care provider Consider asking the following questions so you know what to do on days when you are sick:  Should I adjust my diabetes medicines?  How often do I need to check my blood glucose?  What supplies do I need to manage my diabetes at home when I am sick?  What number can I call if I have  questions?  What foods and drinks should I avoid?  Contact a health care provider if:  You develop symptoms of diabetic ketoacidosis, such as: ? Fatigue. ? Weight loss. ? Excessive thirst. ? Light-headedness. ? Fruity or sweet-smelling breath. ? Excessive urination. ? Vision changes. ? Confusion or irritability. ? Nausea. ? Vomiting. ? Rapid breathing. ? Pain in the abdomen. ? Feeling flushed.  You are unable to drink fluids without vomiting.  You have any of the following for more than 6 hours: ? Nausea. ? Vomiting. ? Diarrhea.  Your blood glucose is at or above 240 mg/dL (13.3 mmol/L), even after you take an additional insulin dose.  You have a change in how you think, feel, or act (mental status).  You develop another serious illness.  You have been sick or have had a fever for 2 days or longer and you are not getting better. Get help right away if:  Your blood glucose is lower than 54 mg/dL (3.0 mmol/L).  You have difficulty breathing.  You have moderate or high ketone levels in your urine.  You used emergency glucagon to treat low blood glucose. Summary  Blood sugar (glucose) can be difficult to control when you are sick. Common illnesses that can cause problems for people with diabetes (diabetes mellitus) include colds, fever, flu (influenza), nausea, vomiting, and diarrhea.  Illnesses can cause stress and loss of body fluids (dehydration), and those issues can cause blood glucose levels to increase.  Make a plan for days when you are sick (sick day plan) as part of your diabetes management plan. You and your health care provider should make this plan in advance.  It is very important to take your insulin and diabetes medicines and to eat some form of carbohydrate when you are sick.  Contact your health care provider if have problems managing your blood glucose levels when you are sick, or if you have been sick or had a fever for 2 days or longer and are  not getting better. This information is not intended to replace advice given to you by your health care provider. Make sure you discuss any questions you have with your health care provider. Document Released: 08/06/2003 Document Revised: 05/01/2016 Document Reviewed: 05/01/2016 Elsevier Interactive Patient Education  2018 [employer].  Cellulitis, Adult Cellulitis is a skin infection. The infected area is usually red and tender. This condition occurs most often in the arms and lower legs. The infection can travel to the muscles, blood, and underlying tissue and become serious. It is very important to get treated for this condition. What are the causes? Cellulitis is caused by bacteria. The bacteria enter through a break in the skin, such as a cut, burn, insect bite, open sore, or crack. What increases the risk? This condition is more likely to occur in people who:  Have a weak defense system (immune system).  Have open wounds on the skin such as cuts, burns, bites, and scrapes. Bacteria can enter the body through these open wounds.  Are older.  Have diabetes.  Have a type of long-lasting (chronic) liver disease (cirrhosis) or kidney disease.  Use IV drugs.  What are the signs or symptoms? Symptoms of this condition include:  Redness, streaking, or spotting on the skin.  Swollen area of the skin.  Tenderness or pain when an area of the skin is touched.  Warm skin.  Fever.  Chills.  Blisters.  How is this diagnosed? This condition is diagnosed based on a medical history and physical exam. You may also have tests, including:  Blood tests.  Lab tests.  Imaging tests.  How is this treated? Treatment for this condition may include:  Medicines, such as antibiotic medicines or antihistamines.  Supportive care, such as rest and application of cold or warm cloths (cold or warm compresses) to the skin.  Hospital care, if the condition is severe.  The infection  usually gets better within 1-2 days of treatment. Follow these instructions at home:  Take over-the-counter and prescription medicines only as told by your health care provider.  If you were prescribed an antibiotic medicine, take it as told by your health care provider. Do not stop taking the antibiotic even if you start to feel better.  Drink enough fluid to keep your urine clear or pale yellow.  Do not touch or rub the infected area.  Raise (elevate) the infected area above the level of your heart while you are sitting or lying down.  Apply warm or cold compresses to the affected area as told by your health care provider.  Keep all follow-up visits as told by your health care provider. This is important. These visits let your health care provider make sure a more serious infection is not developing. Contact a health care provider if:  You have a fever.  Your symptoms do not improve within 1-2 days of starting treatment.  Your bone or joint underneath the infected area becomes painful after the skin has healed.  Your infection returns in the same area or another area.  You notice a swollen bump in the infected area.  You develop new symptoms.  You have a general ill feeling (malaise) with muscle aches and pains. Get help right away if:  Your symptoms get worse.  You feel very sleepy.  You develop vomiting or diarrhea that persists.  You notice red streaks coming from the infected area.  Your red area gets larger or turns dark in color. This information is not intended to replace advice given to you by your health care provider. Make sure you discuss any questions you have with your health care provider. Document Released: 05/13/2005 Document Revised: 12/12/2015 Document Reviewed: 06/12/2015 Elsevier Interactive Patient Education  [employer].

## 2018-05-08 ENCOUNTER — Inpatient Hospital Stay (HOSPITAL_COMMUNITY)
Admission: EM | Admit: 2018-05-08 | Discharge: 2018-05-14 | DRG: 872 | Disposition: A | Discharge status: Home / Self Care | Payer: 59 | Attending: Internal Medicine | Admitting: Internal Medicine

## 2018-05-08 ENCOUNTER — Encounter (HOSPITAL_COMMUNITY): Payer: Self-pay

## 2018-05-08 ENCOUNTER — Emergency Department (HOSPITAL_COMMUNITY): Payer: 59

## 2018-05-08 ENCOUNTER — Other Ambulatory Visit: Payer: Self-pay

## 2018-05-08 DIAGNOSIS — L03314 Cellulitis of groin: Secondary | ICD-10-CM | POA: Diagnosis present

## 2018-05-08 DIAGNOSIS — M609 Myositis, unspecified: Secondary | ICD-10-CM | POA: Diagnosis present

## 2018-05-08 DIAGNOSIS — L02416 Cutaneous abscess of left lower limb: Secondary | ICD-10-CM | POA: Diagnosis present

## 2018-05-08 DIAGNOSIS — L0390 Cellulitis, unspecified: Secondary | ICD-10-CM | POA: Diagnosis present

## 2018-05-08 DIAGNOSIS — L03116 Cellulitis of left lower limb: Secondary | ICD-10-CM | POA: Diagnosis present

## 2018-05-08 DIAGNOSIS — Z794 Long term (current) use of insulin: Secondary | ICD-10-CM | POA: Diagnosis not present

## 2018-05-08 DIAGNOSIS — A419 Sepsis, unspecified organism: Principal | ICD-10-CM | POA: Diagnosis present

## 2018-05-08 DIAGNOSIS — Z79899 Other long term (current) drug therapy: Secondary | ICD-10-CM

## 2018-05-08 DIAGNOSIS — L02214 Cutaneous abscess of groin: Secondary | ICD-10-CM | POA: Diagnosis present

## 2018-05-08 DIAGNOSIS — B9562 Methicillin resistant Staphylococcus aureus infection as the cause of diseases classified elsewhere: Secondary | ICD-10-CM | POA: Diagnosis present

## 2018-05-08 DIAGNOSIS — E119 Type 2 diabetes mellitus without complications: Secondary | ICD-10-CM

## 2018-05-08 DIAGNOSIS — Z833 Family history of diabetes mellitus: Secondary | ICD-10-CM

## 2018-05-08 DIAGNOSIS — Z8249 Family history of ischemic heart disease and other diseases of the circulatory system: Secondary | ICD-10-CM

## 2018-05-08 DIAGNOSIS — L03115 Cellulitis of right lower limb: Secondary | ICD-10-CM | POA: Diagnosis not present

## 2018-05-08 DIAGNOSIS — I10 Essential (primary) hypertension: Secondary | ICD-10-CM | POA: Diagnosis not present

## 2018-05-08 DIAGNOSIS — E669 Obesity, unspecified: Secondary | ICD-10-CM | POA: Diagnosis present

## 2018-05-08 DIAGNOSIS — Z6835 Body mass index (BMI) 35.0-35.9, adult: Secondary | ICD-10-CM

## 2018-05-08 LAB — CBC WITH DIFFERENTIAL/PLATELET
Basophils Absolute: 0.1 10*3/uL (ref 0.0–0.1)
Basophils Relative: 0 %
EOS ABS: 0.3 10*3/uL (ref 0.0–0.7)
EOS PCT: 2 %
HCT: 46.1 % (ref 39.0–52.0)
HEMOGLOBIN: 16.2 g/dL (ref 13.0–17.0)
LYMPHS ABS: 1.6 10*3/uL (ref 0.7–4.0)
Lymphocytes Relative: 8 %
MCH: 30.6 pg (ref 26.0–34.0)
MCHC: 35.1 g/dL (ref 30.0–36.0)
MCV: 87.1 fL (ref 78.0–100.0)
MONO ABS: 1.5 10*3/uL — AB (ref 0.1–1.0)
MONOS PCT: 8 %
Neutro Abs: 16.8 10*3/uL — ABNORMAL HIGH (ref 1.7–7.7)
Neutrophils Relative %: 82 %
Platelets: 483 10*3/uL — ABNORMAL HIGH (ref 150–400)
RBC: 5.29 MIL/uL (ref 4.22–5.81)
RDW: 13.4 % (ref 11.5–15.5)
WBC: 20.2 10*3/uL — ABNORMAL HIGH (ref 4.0–10.5)

## 2018-05-08 LAB — COMPREHENSIVE METABOLIC PANEL
ALK PHOS: 96 U/L (ref 38–126)
ALT: 15 U/L (ref 0–44)
ANION GAP: 15 (ref 5–15)
AST: 18 U/L (ref 15–41)
Albumin: 3.2 g/dL — ABNORMAL LOW (ref 3.5–5.0)
BUN: 17 mg/dL (ref 6–20)
CO2: 22 mmol/L (ref 22–32)
Calcium: 8.8 mg/dL — ABNORMAL LOW (ref 8.9–10.3)
Chloride: 101 mmol/L (ref 98–111)
Creatinine, Ser: 1.06 mg/dL (ref 0.61–1.24)
GFR calc non Af Amer: >60 mL/min (ref >60)
Glucose, Bld: 117 mg/dL — ABNORMAL HIGH (ref 70–99)
Potassium: 3.9 mmol/L (ref 3.5–5.1)
SODIUM: 138 mmol/L (ref 135–145)
TOTAL PROTEIN: 7.5 g/dL (ref 6.5–8.1)
Total Bilirubin: 0.9 mg/dL (ref 0.3–1.2)

## 2018-05-08 LAB — GLUCOSE, CAPILLARY
GLUCOSE-CAPILLARY: 135 mg/dL — AB (ref 70–99)
Glucose-Capillary: 119 mg/dL — ABNORMAL HIGH (ref 70–99)

## 2018-05-08 MED ORDER — ENOXAPARIN SODIUM 40 MG/0.4ML ~~LOC~~ SOLN
40.0000 mg | SUBCUTANEOUS | Status: DC
  Administered 2018-05-08 – 2018-05-13 (×6): 40 mg via SUBCUTANEOUS
  Filled 2018-05-08 (×6): qty 0.4

## 2018-05-08 MED ORDER — ONDANSETRON HCL 4 MG/2ML IJ SOLN
4.0000 mg | Freq: Once | INTRAMUSCULAR | Status: AC
  Administered 2018-05-08: 4 mg via INTRAVENOUS
  Filled 2018-05-08: qty 2

## 2018-05-08 MED ORDER — KETOROLAC TROMETHAMINE 15 MG/ML IJ SOLN
15.0000 mg | Freq: Once | INTRAMUSCULAR | Status: AC
  Administered 2018-05-08: 15 mg via INTRAVENOUS
  Filled 2018-05-08: qty 1

## 2018-05-08 MED ORDER — VANCOMYCIN HCL 10 G IV SOLR
2000.0000 mg | Freq: Once | INTRAVENOUS | Status: AC
  Administered 2018-05-08: 2000 mg via INTRAVENOUS
  Filled 2018-05-08: qty 2000

## 2018-05-08 MED ORDER — AMLODIPINE BESYLATE 5 MG PO TABS
5.0000 mg | ORAL_TABLET | Freq: Every day | ORAL | Status: DC
  Administered 2018-05-09 – 2018-05-14 (×5): 5 mg via ORAL
  Filled 2018-05-08 (×5): qty 1

## 2018-05-08 MED ORDER — INSULIN GLARGINE 100 UNIT/ML ~~LOC~~ SOLN
20.0000 [IU] | Freq: Every day | SUBCUTANEOUS | Status: DC
  Administered 2018-05-08 – 2018-05-09 (×2): 20 [IU] via SUBCUTANEOUS
  Filled 2018-05-08 (×3): qty 0.2

## 2018-05-08 MED ORDER — INSULIN ASPART 100 UNIT/ML ~~LOC~~ SOLN
0.0000 [IU] | Freq: Three times a day (TID) | SUBCUTANEOUS | Status: DC
  Administered 2018-05-10: 2 [IU] via SUBCUTANEOUS
  Administered 2018-05-11 – 2018-05-12 (×4): 1 [IU] via SUBCUTANEOUS
  Administered 2018-05-12: 3 [IU] via SUBCUTANEOUS
  Administered 2018-05-12 – 2018-05-13 (×2): 1 [IU] via SUBCUTANEOUS
  Administered 2018-05-13 (×2): 2 [IU] via SUBCUTANEOUS
  Administered 2018-05-14 (×2): 1 [IU] via SUBCUTANEOUS

## 2018-05-08 MED ORDER — SODIUM CHLORIDE 0.9 % IV BOLUS
1000.0000 mL | Freq: Once | INTRAVENOUS | Status: AC
Start: 2018-05-08 — End: 2018-05-08
  Administered 2018-05-08: 1000 mL via INTRAVENOUS

## 2018-05-08 MED ORDER — HYDROMORPHONE HCL 1 MG/ML IJ SOLN
1.0000 mg | Freq: Once | INTRAMUSCULAR | Status: AC
  Administered 2018-05-08: 1 mg via INTRAVENOUS
  Filled 2018-05-08: qty 1

## 2018-05-08 MED ORDER — ACETAMINOPHEN 325 MG PO TABS
650.0000 mg | ORAL_TABLET | Freq: Four times a day (QID) | ORAL | Status: DC | PRN
  Administered 2018-05-09 (×2): 650 mg via ORAL
  Filled 2018-05-08 (×2): qty 2

## 2018-05-08 MED ORDER — SODIUM CHLORIDE 0.9 % IV SOLN
INTRAVENOUS | Status: AC
  Administered 2018-05-08: 15:00:00 via INTRAVENOUS

## 2018-05-08 MED ORDER — CLINDAMYCIN PHOSPHATE 600 MG/50ML IV SOLN
600.0000 mg | Freq: Three times a day (TID) | INTRAVENOUS | Status: DC
  Administered 2018-05-08 – 2018-05-09 (×3): 600 mg via INTRAVENOUS
  Filled 2018-05-08 (×4): qty 50

## 2018-05-08 MED ORDER — IOPAMIDOL (ISOVUE-300) INJECTION 61%
INTRAVENOUS | Status: AC
  Filled 2018-05-08: qty 100

## 2018-05-08 MED ORDER — LISINOPRIL 10 MG PO TABS
10.0000 mg | ORAL_TABLET | Freq: Every day | ORAL | Status: DC
  Administered 2018-05-09 – 2018-05-14 (×5): 10 mg via ORAL
  Filled 2018-05-08 (×5): qty 1

## 2018-05-08 MED ORDER — IOPAMIDOL (ISOVUE-300) INJECTION 61%
100.0000 mL | Freq: Once | INTRAVENOUS | Status: AC | PRN
  Administered 2018-05-08: 100 mL via INTRAVENOUS

## 2018-05-08 NOTE — Progress Notes (Signed)
A consult was received from an ED physician for vancomycin per pharmacy dosing.  The patient's profile has been reviewed for ht/wt/allergies/indication/available labs.   A one time order has been placed for vancomycin 2gm.   Further antibiotics/pharmacy consults should be ordered by admitting physician if indicated.                       Thank you, Dolly Rias RPh 05/08/2018, 9:38 AM Pager [phone]

## 2018-05-08 NOTE — ED Provider Notes (Addendum)
[facility] DEPT Provider Note   CSN: [id number] Arrival date & time: 05/08/18  0854     History   Chief Complaint Chief Complaint  Patient presents with  . Cellulitis    left groin area    HPI Erik Rojas is a 47 y.o. male.  HPI 47 year old male with history of previous scrotal abscess, type 2 diabetes, here with left inguinal pain.  The patient states that his symptoms started early this week.  He was seen by his PCP on Thursday and his urologist on Wednesday.  His urologist started him on Bactrim and has been taking this as prescribed.  He states that since taking it, he initially felt like he was improving, but has now worsened.  He reports progressively worsening severe left inguinal skin pain.  The pain is aching, throbbing, worse with movement and palpation but he said associated chills but no known fevers.  No nausea or vomiting.  Denies any pain or extension to his scrotum.  No urinary symptoms.  Past Medical History:  Diagnosis Date  . HTN (hypertension)   . Scrotal abscess 01/28/2018   right  . Type II diabetes mellitus ([place]) 01/27/2018   "just dx'd" (01/28/2018)    Patient Active Problem List   Diagnosis Date Noted  . Sepsis ([place]) 01/27/2018  . Scrotal abscess 01/27/2018  . New onset type 2 diabetes mellitus ([place]) 01/27/2018  . HTN (hypertension) 01/27/2018    Past Surgical History:  Procedure Laterality Date  . NO PAST SURGERIES          Home Medications    Prior to Admission medications   Medication Sig Start Date End Date Taking? Authorizing Provider  acetaminophen (TYLENOL) 325 MG tablet Take 2 tablets (650 mg total) by mouth every 6 (six) hours as needed for mild pain (or Fever >/= 101). 01/30/18  Yes Emokpae, Courage, MD  amLODipine (NORVASC) 5 MG tablet Take 1 tablet (5 mg total) by mouth daily. For BP 01/31/18  Yes Emokpae, Courage, MD  blood glucose meter kit and supplies Relion Prime or Dispense other  brand based on patient and insurance preference. Use up to four times daily as directed. (FOR ICD-9 250.00, 250.01). 01/30/18  Yes Emokpae, Courage, MD  Continuous Blood Gluc Receiver (FREESTYLE LIBRE 14 DAY READER) DEVI 1 each by Does not apply route daily. And PRN. 03/17/18  Yes Burchette, Alinda Sierras, MD  Continuous Blood Gluc Sensor (FREESTYLE LIBRE 14 DAY SENSOR) MISC Place 1 each onto the skin every 14 (fourteen) days. And PRN. 04/06/18  Yes Billie Ruddy, MD  glucose blood (ONETOUCH VERIO) test strip Use as instructed 02/25/18  Yes Billie Ruddy, MD  insulin lispro (HUMALOG) 100 UNIT/ML injection Inject 3 Units into the skin 3 (three) times daily before meals.   Yes [provider]  Insulin Pen Needle (PEN NEEDLES 3/16") 31G X 5 MM MISC Used to inject insulin with meals as advised- insulin pen needles (#[id number]), Dx 250.02 02/25/18  Yes Billie Ruddy, MD  Lancets [facility] ULTRASOFT) lancets Check glucose three times daily more for Hypoglycemia and Hyperglycemia. 02/25/18  Yes Billie Ruddy, MD  LANTUS SOLOSTAR 100 UNIT/ML Solostar Pen INJECT 25 UNITS INTO THE SKIN DAILY AT 10 PM. INSULIN 02/25/18  Yes Billie Ruddy, MD  lisinopril (PRINIVIL,ZESTRIL) 10 MG tablet Take 1 tablet (10 mg total) by mouth daily. For BP.... Start on 02/08/18 after you Complete Bactrim (Sulfa) antibiotic 01/30/18 01/30/19 Yes Roxan Hockey, MD  metFORMIN (GLUCOPHAGE) 1000  MG tablet Take 1 tablet (1,000 mg total) by mouth 2 (two) times daily with a meal. For Diabetes 01/30/18  Yes Emokpae, Courage, MD  sulfamethoxazole-trimethoprim (BACTRIM DS,SEPTRA DS) 800-160 MG tablet Take 1 tablet by mouth 2 (two) times daily.   Yes [provider]  fluconazole (DIFLUCAN) 200 MG tablet Take 1 tablet (200 mg total) by mouth daily. Patient not taking: Reported on 05/08/2018 01/31/18   Roxan Hockey, MD  insulin aspart (NOVOLOG) 100 UNIT/ML FlexPen Inject 3 Units into the skin 3 (three) times daily with meals. As long  as Glucose is >90 pre-meal. Do NOT take if you do NOT eat Patient not taking: Reported on 05/08/2018 01/30/18   Roxan Hockey, MD    Family History Family History  Problem Relation Age of Onset  . Hypertension Mother   . Diabetes Mother   . Diabetes Brother   . Cancer Father   . Cancer Sister     Social History Social History   Tobacco Use  . Smoking status: Never Smoker  . Smokeless tobacco: Never Used  Substance Use Topics  . Alcohol use: Yes    Comment: 01/28/2018 "2 drinks/month; wine or whiskey"  . Drug use: Never     Allergies   Patient has no known allergies.   Review of Systems Review of Systems  Constitutional: Positive for chills and fatigue. Negative for fever.  HENT: Negative for congestion and rhinorrhea.   Eyes: Negative for visual disturbance.  Respiratory: Negative for cough, shortness of breath and wheezing.   Cardiovascular: Negative for chest pain and leg swelling.  Gastrointestinal: Negative for abdominal pain, diarrhea, nausea and vomiting.  Genitourinary: Negative for dysuria and flank pain.  Musculoskeletal: Negative for neck pain and neck stiffness.  Skin: Positive for rash. Negative for wound.  Allergic/Immunologic: Negative for immunocompromised state.  Neurological: Negative for syncope, weakness and headaches.  All other systems reviewed and are negative.    Physical Exam Updated Vital Signs BP (!) 138/99 (BP Location: Left Arm)   Pulse 72   Temp 98.3 F (36.8 C) (Oral)   Ht _0  (1.651 m)   Wt 98 kg   SpO2 98%   BMI 35.94 kg/m   Physical Exam  Constitutional: He is oriented to person, place, and time. He appears well-developed and well-nourished. No distress.  HENT:  Head: Normocephalic and atraumatic.  Eyes: Conjunctivae are normal.  Neck: Neck supple.  Cardiovascular: Normal rate, regular rhythm and normal heart sounds. Exam reveals no friction rub.  No murmur heard. Pulmonary/Chest: Effort normal and breath sounds  normal. No respiratory distress. He has no wheezes. He has no rales.  Abdominal: He exhibits no distension.  Genitourinary:  Genitourinary Comments: Penis normal.  Testes descended and without tenderness bilaterally.  There is significant induration and erythema along the left proximal thigh and inguinal area, with firm induration along the medial aspect of the proximal thigh.  No purulence.  No crepitance.  Musculoskeletal: He exhibits no edema.  Neurological: He is alert and oriented to person, place, and time. He exhibits normal muscle tone.  Skin: Skin is warm. Capillary refill takes less than 2 seconds.  Psychiatric: He has a normal mood and affect.  Nursing note and vitals reviewed.    ED Treatments / Results  Labs (all labs ordered are listed, but only abnormal results are displayed) Labs Reviewed  CBC WITH DIFFERENTIAL/PLATELET - Abnormal; Notable for the following components:      Result Value   WBC 20.2 (*)  Platelets 483 (*)    Neutro Abs 16.8 (*)    Monocytes Absolute 1.5 (*)    All other components within normal limits  COMPREHENSIVE METABOLIC PANEL - Abnormal; Notable for the following components:   Glucose, Bld 117 (*)    Calcium 8.8 (*)    Albumin 3.2 (*)    All other components within normal limits  CULTURE, BLOOD (ROUTINE X 2)  CULTURE, BLOOD (ROUTINE X 2)    EKG None  Radiology Ct Pelvis W Contrast  Result Date: 05/08/2018 CLINICAL DATA:  Inguinal and proximal thigh cellulitis. History of scrotal abscess. Evaluate for recurrent abscess. EXAM: CT PELVIS WITH CONTRAST TECHNIQUE: Multidetector CT imaging of the pelvis was performed using the standard protocol following the bolus administration of intravenous contrast. CONTRAST:  138m ISOVUE-300 IOPAMIDOL (ISOVUE-300) INJECTION 61% COMPARISON:  CT pelvis 01/27/2018. FINDINGS: Urinary Tract: The visualized distal ureters and bladder appear unremarkable. Bowel: No bowel wall thickening, distention or surrounding  inflammation identified within the pelvis. The appendix appears normal. Vascular/Lymphatic: No significant vascular findings. There are several prominent left pelvic lymph nodes which are likely reactive. The largest nodes include left external iliac nodes measuring 10 mm on image 70/3 and 12 mm on image 83/3. No significant inguinal adenopathy. Reproductive: The prostate gland and seminal vesicles appear normal. There are calcifications of the vas deferens bilaterally. The scrotum appears normal without recurrent soft tissue emphysema or a hydrocele. Other: Small left inguinal hernia containing only fat. Musculoskeletal: There is new diffuse subcutaneous edema anteriorly in the left lower pelvis and anteromedially in the proximal left thigh. The distal extent of this is not imaged by this examination. There is no focal fluid collection, foreign body or soft tissue emphysema. The proximal left thigh musculature appears normal. No evidence of hip joint effusion, fracture or osteomyelitis. 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 IMPRESSION: 1. Extensive soft tissue edema in the anterior left pelvis and proximal left thigh consistent with cellulitis. No evidence of abscess or myositis. 2. No evidence of recurrent scrotal infection. No soft tissue emphysema. 3. Probable reactive left pelvic adenopathy. Electronically Signed   By: WRichardean SaleM.D.   On: 05/08/2018 12:11    Procedures .Critical Care Performed by: IDuffy Bruce MD Authorized by: IDuffy Bruce MD   Critical care provider statement:    Critical care time (minutes):  35   Critical care time was exclusive of:  Separately billable procedures and treating other patients and teaching time   Critical care was necessary to treat or prevent imminent or life-threatening deterioration of the following conditions:  Circulatory failure, cardiac failure and sepsis   Critical care was time spent personally by me  on the following activities:  Development of treatment plan with patient or surrogate, discussions with consultants, evaluation of patient's response to treatment, examination of patient, obtaining history from patient or surrogate, ordering and performing treatments and interventions, ordering and review of laboratory studies, ordering and review of radiographic studies, pulse oximetry, re-evaluation of patient's condition and review of old charts   I assumed direction of critical care for this patient from another provider in my specialty: no     (including critical care time)  Medications Ordered in ED Medications  vancomycin (VANCOCIN) 2,000 mg in sodium chloride 0.9 % 500 mL IVPB (2,000 mg Intravenous New Bag/Given 05/08/18 1015)  iopamidol (ISOVUE-300) 61 % injection (has no administration in time range)  ketorolac (TORADOL) 15 MG/ML injection 15 mg (has no administration in time  range)  sodium chloride 0.9 % bolus 1,000 mL (0 mLs Intravenous Stopped 05/08/18 1146)  HYDROmorphone (DILAUDID) injection 1 mg (1 mg Intravenous Given 05/08/18 1001)  ondansetron (ZOFRAN) injection 4 mg (4 mg Intravenous Given 05/08/18 1001)  iopamidol (ISOVUE-300) 61 % injection 100 mL (100 mLs Intravenous Contrast Given 05/08/18 1143)     Initial Impression / Assessment and Plan / ED Course  I have reviewed the triage vital signs and the nursing notes.  Pertinent labs & imaging results that were available during my care of the patient were reviewed by me and considered in my medical decision making (see chart for details).     47 year old male here with cellulitis of left inguinal area/upper leg.  Patient has failed outpatient Bactrim.  Fortunately, CT scan shows no abscess formation or extension to the scrotum.  Will admit for IV antibiotics.  No signs of severe sepsis.  Final Clinical Impressions(s) / ED Diagnoses   Final diagnoses:  Cellulitis of left leg    ED Discharge Orders    None         Duffy Bruce, MD 05/08/18 1215    Duffy Bruce, MD 05/16/18 [phone]

## 2018-05-08 NOTE — H&P (Signed)
History and Physical    Erik Rojas MVH:[id number] DOB: 1970/12/22 DOA: 05/08/2018  I have briefly reviewed the patient's prior medical records in [place]  PCP: Billie Ruddy, MD  Patient coming from: home  Chief Complaint: left thigh cellulitis   HPI: Erik Rojas is a 47 y.o. male with medical history significant of hypertension, diabetes mellitus, who was recently hospitalized 3 months ago for testicular abscess which is now resolved, presents to the emergency room with chief complaint of left inguinal area and left thigh redness.  He noticed that he has developed redness about 5 days ago.  He followed with his urologist which she was seen for his prior scrotal abscess, he was diagnosed with cellulitis and placed on Bactrim.  Despite being on Bactrim, over the last few days, he noticed that he is inguinal area and thigh redness has progressively gotten larger and larger.  He denies any fever or chills currently, he did have a temp of 100 few days ago.  He has no lightheadedness or dizziness.  He denies any abdominal pain, nausea or vomiting.  He reports compliance with his diabetes, has a libre CGM and his sugars are pretty well controlled per his report.  ED Course: In the emergency room his vital signs are stable, blood work is remarkable for a white count of 20.  Because he failed oral antibiotics he was given IV vancomycin and we are asked to admit.  He underwent a CT scan of that area without any deep abscesses or subcutaneous gas.  Does not appear to have scrotal involvement this time  Review of Systems: As per HPI otherwise 10 point review of systems negative.   Past Medical History:  Diagnosis Date  . HTN (hypertension)   . Scrotal abscess 01/28/2018   right  . Type II diabetes mellitus ([place]) 01/27/2018   "just dx'd" (01/28/2018)    Past Surgical History:  Procedure Laterality Date  . NO PAST SURGERIES       reports that he has never  smoked. He has never used smokeless tobacco. He reports that he drinks alcohol. He reports that he does not use drugs.  No Known Allergies  Family History  Problem Relation Age of Onset  . Hypertension Mother   . Diabetes Mother   . Diabetes Brother   . Cancer Father   . Cancer Sister     Prior to Admission medications   Medication Sig Start Date End Date Taking? Authorizing Provider  acetaminophen (TYLENOL) 325 MG tablet Take 2 tablets (650 mg total) by mouth every 6 (six) hours as needed for mild pain (or Fever >/= 101). 01/30/18  Yes Emokpae, Courage, MD  amLODipine (NORVASC) 5 MG tablet Take 1 tablet (5 mg total) by mouth daily. For BP 01/31/18  Yes Emokpae, Courage, MD  blood glucose meter kit and supplies Relion Prime or Dispense other brand based on patient and insurance preference. Use up to four times daily as directed. (FOR ICD-9 250.00, 250.01). 01/30/18  Yes Emokpae, Courage, MD  Continuous Blood Gluc Receiver (FREESTYLE LIBRE 14 DAY READER) DEVI 1 each by Does not apply route daily. And PRN. 03/17/18  Yes Burchette, Alinda Sierras, MD  Continuous Blood Gluc Sensor (FREESTYLE LIBRE 14 DAY SENSOR) MISC Place 1 each onto the skin every 14 (fourteen) days. And PRN. 04/06/18  Yes Billie Ruddy, MD  glucose blood (ONETOUCH VERIO) test strip Use as instructed 02/25/18  Yes Billie Ruddy, MD  insulin lispro (HUMALOG) 100 UNIT/ML  injection Inject 3 Units into the skin 3 (three) times daily before meals.   Yes [provider]  Insulin Pen Needle (PEN NEEDLES 3/16") 31G X 5 MM MISC Used to inject insulin with meals as advised- insulin pen needles (#[id number]), Dx 250.02 02/25/18  Yes Billie Ruddy, MD  Lancets [facility] ULTRASOFT) lancets Check glucose three times daily more for Hypoglycemia and Hyperglycemia. 02/25/18  Yes Billie Ruddy, MD  LANTUS SOLOSTAR 100 UNIT/ML Solostar Pen INJECT 25 UNITS INTO THE SKIN DAILY AT 10 PM. INSULIN 02/25/18  Yes Billie Ruddy, MD  lisinopril  (PRINIVIL,ZESTRIL) 10 MG tablet Take 1 tablet (10 mg total) by mouth daily. For BP.... Start on 02/08/18 after you Complete Bactrim (Sulfa) antibiotic 01/30/18 01/30/19 Yes Roxan Hockey, MD  metFORMIN (GLUCOPHAGE) 1000 MG tablet Take 1 tablet (1,000 mg total) by mouth 2 (two) times daily with a meal. For Diabetes 01/30/18  Yes Emokpae, Courage, MD  sulfamethoxazole-trimethoprim (BACTRIM DS,SEPTRA DS) 800-160 MG tablet Take 1 tablet by mouth 2 (two) times daily.   Yes [provider]  fluconazole (DIFLUCAN) 200 MG tablet Take 1 tablet (200 mg total) by mouth daily. Patient not taking: Reported on 05/08/2018 01/31/18   Roxan Hockey, MD  insulin aspart (NOVOLOG) 100 UNIT/ML FlexPen Inject 3 Units into the skin 3 (three) times daily with meals. As long as Glucose is >90 pre-meal. Do NOT take if you do NOT eat Patient not taking: Reported on 05/08/2018 01/30/18   Roxan Hockey, MD    Physical Exam: Vitals:   05/08/18 0904  BP: (!) 138/99  Pulse: 72  Temp: 98.3 F (36.8 C)  TempSrc: Oral  SpO2: 98%  Weight: 98 kg  Height: _0  (1.651 m)      Constitutional: NAD, calm, comfortable Eyes: PERRL, lids and conjunctivae normal ENMT: Mucous membranes are moist. Posterior pharynx clear of any exudate or lesions.Normal dentition.  Neck: normal, supple, no masses, no thyromegaly Respiratory: clear to auscultation bilaterally, no wheezing, no crackles. Normal respiratory effort. No accessory muscle use.  Cardiovascular: Regular rate and rhythm, no murmurs / rubs / gallops. No extremity edema. 2+ pedal pulses.  Abdomen: no tenderness, no masses palpated. Bowel sounds positive.  Musculoskeletal: no clubbing / cyanosis. Normal muscle tone.  Skin: Large cellulitic area on left thigh, margins delineated. Neurologic: CN 2-12 grossly intact. Strength 5/5 in all 4.  Psychiatric: Normal judgment and insight. Alert and oriented x 3. Normal mood.   Labs on Admission: I have personally reviewed  following labs and imaging studies  CBC: Recent Labs  Lab 05/08/18 1008  WBC 20.2*  NEUTROABS 16.8*  HGB 16.2  HCT 46.1  MCV 87.1  PLT 017*   Basic Metabolic Panel: Recent Labs  Lab 05/08/18 1008  NA 138  K 3.9  CL 101  CO2 22  GLUCOSE 117*  BUN 17  CREATININE 1.06  CALCIUM 8.8*   GFR: Estimated Creatinine Clearance: 95.7 mL/min (by C-G formula based on SCr of 1.06 mg/dL). Liver Function Tests: Recent Labs  Lab 05/08/18 1008  AST 18  ALT 15  ALKPHOS 96  BILITOT 0.9  PROT 7.5  ALBUMIN 3.2*   No results for input(s): LIPASE, AMYLASE in the last 168 hours. No results for input(s): AMMONIA in the last 168 hours. Coagulation Profile: No results for input(s): INR, PROTIME in the last 168 hours. Cardiac Enzymes: No results for input(s): CKTOTAL, CKMB, CKMBINDEX, TROPONINI in the last 168 hours. BNP (last 3 results) No results for input(s): PROBNP in  the last 8760 hours. HbA1C: No results for input(s): HGBA1C in the last 72 hours. CBG: No results for input(s): GLUCAP in the last 168 hours. Lipid Profile: No results for input(s): CHOL, HDL, LDLCALC, TRIG, CHOLHDL, LDLDIRECT in the last 72 hours. Thyroid Function Tests: No results for input(s): TSH, T4TOTAL, FREET4, T3FREE, THYROIDAB in the last 72 hours. Anemia Panel: No results for input(s): VITAMINB12, FOLATE, FERRITIN, TIBC, IRON, RETICCTPCT in the last 72 hours. Urine analysis:    Component Value Date/Time   COLORURINE YELLOW 01/28/2018 1900   APPEARANCEUR CLEAR 01/28/2018 1900   LABSPEC 1.036 (H) 01/28/2018 1900   PHURINE 5.0 01/28/2018 1900   GLUCOSEU >=500 (A) 01/28/2018 1900   HGBUR NEGATIVE 01/28/2018 1900   BILIRUBINUR NEGATIVE 01/28/2018 1900   KETONESUR 5 (A) 01/28/2018 1900   PROTEINUR NEGATIVE 01/28/2018 1900   NITRITE NEGATIVE 01/28/2018 1900   LEUKOCYTESUR NEGATIVE 01/28/2018 1900     Radiological Exams on Admission: Ct Pelvis W Contrast  Result Date: 05/08/2018 CLINICAL DATA:   Inguinal and proximal thigh cellulitis. History of scrotal abscess. Evaluate for recurrent abscess. EXAM: CT PELVIS WITH CONTRAST TECHNIQUE: Multidetector CT imaging of the pelvis was performed using the standard protocol following the bolus administration of intravenous contrast. CONTRAST:  15m ISOVUE-300 IOPAMIDOL (ISOVUE-300) INJECTION 61% COMPARISON:  CT pelvis 01/27/2018. FINDINGS: Urinary Tract: The visualized distal ureters and bladder appear unremarkable. Bowel: No bowel wall thickening, distention or surrounding inflammation identified within the pelvis. The appendix appears normal. Vascular/Lymphatic: No significant vascular findings. There are several prominent left pelvic lymph nodes which are likely reactive. The largest nodes include left external iliac nodes measuring 10 mm on image 70/3 and 12 mm on image 83/3. No significant inguinal adenopathy. Reproductive: The prostate gland and seminal vesicles appear normal. There are calcifications of the vas deferens bilaterally. The scrotum appears normal without recurrent soft tissue emphysema or a hydrocele. Other: Small left inguinal hernia containing only fat. Musculoskeletal: There is new diffuse subcutaneous edema anteriorly in the left lower pelvis and anteromedially in the proximal left thigh. The distal extent of this is not imaged by this examination. There is no focal fluid collection, foreign body or soft tissue emphysema. The proximal left thigh musculature appears normal. No evidence of hip joint effusion, fracture or osteomyelitis. 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 IMPRESSION: 1. Extensive soft tissue edema in the anterior left pelvis and proximal left thigh consistent with cellulitis. No evidence of abscess or myositis. 2. No evidence of recurrent scrotal infection. No soft tissue emphysema. 3. Probable reactive left pelvic adenopathy. Electronically Signed   By: WRichardean SaleM.D.   On:  05/08/2018 12:11   Assessment/Plan Active Problems:   Cellulitis   Left thigh cellulitis -CT scan without deep abscesses, placed on IV clindamycin, margins drawn, closely monitor.  Diabetes mellitus -Continue home Lantus at a slightly lower dose and cover with sliding scale as well  Hypertension -Continue home medications   DVT prophylaxis: Lovenox Code Status: Full code Family Communication: No family at bedside Disposition Plan: Home when ready Consults called: None    Admission status: Observation  At the point of initial evaluation, it is my clinical opinion that admission for OBSERVATION is reasonable and necessary because the patient's presenting complaints in the context of their chronic conditions represent sufficient risk of deterioration or significant morbidity to constitute reasonable grounds for close observation in the hospital setting, but that the patient may be medically stable for discharge from the hospital within  24 to 48 hours.    Marzetta Board, MD [facility] Pager [phone]  If 7PM-7AM, please contact night-coverage [email or web address] Password [facility]  05/08/2018, 12:38 PM

## 2018-05-08 NOTE — ED Triage Notes (Signed)
Patient reports "skin infection" that started in left groin area on 05/04/18. Reports it started as a "little pimple" and he pinched it. Went to his PCP 05/04/18 and was given bactrim BID, with no improvement. Pain is 2/10

## 2018-05-08 NOTE — ED Notes (Signed)
ED TO INPATIENT HANDOFF REPORT  Name/Age/Gender Erik Rojas 47 y.o. male  Code Status Code Status History    Date Active Date Inactive Code Status Order ID Comments User Context   01/27/2018 1631 01/30/2018 2056 Full Code [id number]  Samella Parr, NP ED      Home/SNF/Other Home  Chief Complaint skin infection  Level of Care/Admitting Diagnosis ED Disposition    ED Disposition Condition [facility] Area: [facility] [[id number]]  Level of Care: Med-Surg [16]  Diagnosis: Cellulitis [989211]  Admitting Physician: Caren Griffins [[id number]]  Attending Physician: Caren Griffins [[id number]]  PT Class (Do Not Modify): Observation [104]  PT Acc Code (Do Not Modify): Observation [10022]       Medical History Past Medical History:  Diagnosis Date  . HTN (hypertension)   . Scrotal abscess 01/28/2018   right  . Type II diabetes mellitus ([place]) 01/27/2018   "just dx'd" (01/28/2018)    Allergies No Known Allergies  IV Location/Drains/Wounds Patient Lines/Drains/Airways Status   Active Line/Drains/Airways    Name:   Placement date:   Placement time:   Site:   Days:   Peripheral IV 05/08/18 Left Antecubital   05/08/18    0945    Antecubital   less than 1   Incision (Closed) 01/27/18 Scrotum   01/27/18    2018     101          Labs/Imaging Results for orders placed or performed during the hospital encounter of 05/08/18 (from the past 48 hour(s))  CBC with Differential     Status: Abnormal   Collection Time: 05/08/18 10:08 AM  Result Value Ref Range   WBC 20.2 (H) 4.0 - 10.5 K/uL   RBC 5.29 4.22 - 5.81 MIL/uL   Hemoglobin 16.2 13.0 - 17.0 g/dL   HCT 46.1 39.0 - 52.0 %   MCV 87.1 78.0 - 100.0 fL   MCH 30.6 26.0 - 34.0 pg   MCHC 35.1 30.0 - 36.0 g/dL   RDW 13.4 11.5 - 15.5 %   Platelets 483 (H) 150 - 400 K/uL   Neutrophils Relative % 82 %   Neutro Abs 16.8 (H) 1.7 - 7.7 K/uL   Lymphocytes Relative 8 %   Lymphs Abs 1.6 0.7 -  4.0 K/uL   Monocytes Relative 8 %   Monocytes Absolute 1.5 (H) 0.1 - 1.0 K/uL   Eosinophils Relative 2 %   Eosinophils Absolute 0.3 0.0 - 0.7 K/uL   Basophils Relative 0 %   Basophils Absolute 0.1 0.0 - 0.1 K/uL    Comment: Performed at [facility], [place] [address] [place], [place] [address]  Comprehensive metabolic panel     Status: Abnormal   Collection Time: 05/08/18 10:08 AM  Result Value Ref Range   Sodium 138 135 - 145 mmol/L   Potassium 3.9 3.5 - 5.1 mmol/L   Chloride 101 98 - 111 mmol/L   CO2 22 22 - 32 mmol/L   Glucose, Bld 117 (H) 70 - 99 mg/dL   BUN 17 6 - 20 mg/dL   Creatinine, Ser 1.06 0.61 - 1.24 mg/dL   Calcium 8.8 (L) 8.9 - 10.3 mg/dL   Total Protein 7.5 6.5 - 8.1 g/dL   Albumin 3.2 (L) 3.5 - 5.0 g/dL   AST 18 15 - 41 U/L   ALT 15 0 - 44 U/L   Alkaline Phosphatase 96 38 - 126 U/L   Total Bilirubin 0.9 0.3 -  1.2 mg/dL   GFR calc non Af Amer >60 >60 mL/min   GFR calc Af Amer >60 >60 mL/min    Comment: (NOTE) The eGFR has been calculated using the CKD EPI equation. This calculation has not been validated in all clinical situations. eGFR's persistently <60 mL/min signify possible Chronic Kidney Disease.    Anion gap 15 5 - 15    Comment: Performed at [facility], [place] [address], [place], [place] [address]   Ct Pelvis W Contrast  Result Date: 05/08/2018 CLINICAL DATA:  Inguinal and proximal thigh cellulitis. History of scrotal abscess. Evaluate for recurrent abscess. EXAM: CT PELVIS WITH CONTRAST TECHNIQUE: Multidetector CT imaging of the pelvis was performed using the standard protocol following the bolus administration of intravenous contrast. CONTRAST:  170m ISOVUE-300 IOPAMIDOL (ISOVUE-300) INJECTION 61% COMPARISON:  CT pelvis 01/27/2018. FINDINGS: Urinary Tract: The visualized distal ureters and bladder appear unremarkable. Bowel: No bowel wall thickening, distention or surrounding inflammation identified within the  pelvis. The appendix appears normal. Vascular/Lymphatic: No significant vascular findings. There are several prominent left pelvic lymph nodes which are likely reactive. The largest nodes include left external iliac nodes measuring 10 mm on image 70/3 and 12 mm on image 83/3. No significant inguinal adenopathy. Reproductive: The prostate gland and seminal vesicles appear normal. There are calcifications of the vas deferens bilaterally. The scrotum appears normal without recurrent soft tissue emphysema or a hydrocele. Other: Small left inguinal hernia containing only fat. Musculoskeletal: There is new diffuse subcutaneous edema anteriorly in the left lower pelvis and anteromedially in the proximal left thigh. The distal extent of this is not imaged by this examination. There is no focal fluid collection, foreign body or soft tissue emphysema. The proximal left thigh musculature appears normal. No evidence of hip joint effusion, fracture or osteomyelitis. 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 IMPRESSION: 1. Extensive soft tissue edema in the anterior left pelvis and proximal left thigh consistent with cellulitis. No evidence of abscess or myositis. 2. No evidence of recurrent scrotal infection. No soft tissue emphysema. 3. Probable reactive left pelvic adenopathy. Electronically Signed   By: WRichardean SaleM.D.   On: 05/08/2018 12:11    Pending Labs Unresulted Labs (From admission, onward)    Start     Ordered   05/08/18 0933  Blood culture (routine x 2)  BLOOD CULTURE X 2,   STAT     05/08/18 0932   Signed and Held  CBC  Tomorrow morning,   R     Signed and Held   Signed and Held  Basic metabolic panel  Tomorrow morning,   R     Signed and Held          Vitals/Pain Today's Vitals   05/08/18 0904 05/08/18 1302 05/08/18 1311  BP: (!) 138/99  122/79  Pulse: 72  (!) 109  Resp:   20  Temp: 98.3 F (36.8 C)  98.6 F (37 C)  TempSrc: Oral  Oral  SpO2: 98%   100%  Weight: 98 kg    Height: 5' 5"  (1.651 m)    PainSc: 2  0-No pain     Isolation Precautions No active isolations  Medications Medications  iopamidol (ISOVUE-300) 61 % injection (has no administration in time range)  clindamycin (CLEOCIN) IVPB 600 mg (600 mg Intravenous Transfusing/Transfer 05/08/18 1334)  sodium chloride 0.9 % bolus 1,000 mL (0 mLs Intravenous Stopped 05/08/18 1146)  HYDROmorphone (DILAUDID) injection 1 mg (1 mg Intravenous  Given 05/08/18 1001)  ondansetron (ZOFRAN) injection 4 mg (4 mg Intravenous Given 05/08/18 1001)  vancomycin (VANCOCIN) 2,000 mg in sodium chloride 0.9 % 500 mL IVPB (0 mg Intravenous Stopped 05/08/18 1313)  iopamidol (ISOVUE-300) 61 % injection 100 mL (100 mLs Intravenous Contrast Given 05/08/18 1143)  ketorolac (TORADOL) 15 MG/ML injection 15 mg (15 mg Intravenous Given 05/08/18 1316)    Mobility walks

## 2018-05-08 NOTE — Progress Notes (Signed)
Patient arrived on unit from ED. No family at bedside.  

## 2018-05-09 DIAGNOSIS — M609 Myositis, unspecified: Secondary | ICD-10-CM | POA: Diagnosis present

## 2018-05-09 DIAGNOSIS — L02416 Cutaneous abscess of left lower limb: Secondary | ICD-10-CM | POA: Diagnosis not present

## 2018-05-09 DIAGNOSIS — I10 Essential (primary) hypertension: Secondary | ICD-10-CM | POA: Diagnosis present

## 2018-05-09 DIAGNOSIS — Z833 Family history of diabetes mellitus: Secondary | ICD-10-CM | POA: Diagnosis not present

## 2018-05-09 DIAGNOSIS — A419 Sepsis, unspecified organism: Secondary | ICD-10-CM | POA: Diagnosis not present

## 2018-05-09 DIAGNOSIS — Z794 Long term (current) use of insulin: Secondary | ICD-10-CM | POA: Diagnosis not present

## 2018-05-09 DIAGNOSIS — B9562 Methicillin resistant Staphylococcus aureus infection as the cause of diseases classified elsewhere: Secondary | ICD-10-CM | POA: Diagnosis present

## 2018-05-09 DIAGNOSIS — E119 Type 2 diabetes mellitus without complications: Secondary | ICD-10-CM | POA: Diagnosis present

## 2018-05-09 DIAGNOSIS — E669 Obesity, unspecified: Secondary | ICD-10-CM | POA: Diagnosis present

## 2018-05-09 DIAGNOSIS — Z79899 Other long term (current) drug therapy: Secondary | ICD-10-CM | POA: Diagnosis not present

## 2018-05-09 DIAGNOSIS — L03116 Cellulitis of left lower limb: Secondary | ICD-10-CM | POA: Diagnosis not present

## 2018-05-09 DIAGNOSIS — L03314 Cellulitis of groin: Secondary | ICD-10-CM | POA: Diagnosis not present

## 2018-05-09 DIAGNOSIS — L02214 Cutaneous abscess of groin: Secondary | ICD-10-CM | POA: Diagnosis not present

## 2018-05-09 DIAGNOSIS — Z6835 Body mass index (BMI) 35.0-35.9, adult: Secondary | ICD-10-CM | POA: Diagnosis not present

## 2018-05-09 DIAGNOSIS — Z8249 Family history of ischemic heart disease and other diseases of the circulatory system: Secondary | ICD-10-CM | POA: Diagnosis not present

## 2018-05-09 LAB — GLUCOSE, CAPILLARY
GLUCOSE-CAPILLARY: 74 mg/dL (ref 70–99)
GLUCOSE-CAPILLARY: 95 mg/dL (ref 70–99)
Glucose-Capillary: 113 mg/dL — ABNORMAL HIGH (ref 70–99)
Glucose-Capillary: 183 mg/dL — ABNORMAL HIGH (ref 70–99)

## 2018-05-09 LAB — CBC
HEMATOCRIT: 39.3 % (ref 39.0–52.0)
HEMOGLOBIN: 13.7 g/dL (ref 13.0–17.0)
MCH: 31.1 pg (ref 26.0–34.0)
MCHC: 34.9 g/dL (ref 30.0–36.0)
MCV: 89.1 fL (ref 78.0–100.0)
Platelets: 464 10*3/uL — ABNORMAL HIGH (ref 150–400)
RBC: 4.41 MIL/uL (ref 4.22–5.81)
RDW: 13.6 % (ref 11.5–15.5)
WBC: 17.6 10*3/uL — AB (ref 4.0–10.5)

## 2018-05-09 LAB — BASIC METABOLIC PANEL
ANION GAP: 8 (ref 5–15)
BUN: 13 mg/dL (ref 6–20)
CHLORIDE: 103 mmol/L (ref 98–111)
CO2: 27 mmol/L (ref 22–32)
Calcium: 8.1 mg/dL — ABNORMAL LOW (ref 8.9–10.3)
Creatinine, Ser: 1 mg/dL (ref 0.61–1.24)
GFR calc non Af Amer: >60 mL/min (ref >60)
Glucose, Bld: 87 mg/dL (ref 70–99)
POTASSIUM: 4 mmol/L (ref 3.5–5.1)
SODIUM: 138 mmol/L (ref 135–145)

## 2018-05-09 LAB — MRSA PCR SCREENING: MRSA BY PCR: NEGATIVE

## 2018-05-09 LAB — HEMOGLOBIN A1C
HEMOGLOBIN A1C: 5.2 % (ref 4.8–5.6)
MEAN PLASMA GLUCOSE: 102.54 mg/dL

## 2018-05-09 MED ORDER — SODIUM CHLORIDE 0.9 % IV SOLN
1.0000 g | INTRAVENOUS | Status: DC
  Administered 2018-05-09 – 2018-05-10 (×2): 1 g via INTRAVENOUS
  Filled 2018-05-09 (×2): qty 1

## 2018-05-09 NOTE — Progress Notes (Signed)
PROGRESS NOTE  Erik Rojas NOB:[id number] DOB: 1970/11/17 DOA: 05/08/2018 PCP: Billie Ruddy, MD  HPI/Recap of past 24 hours:  Erik Rojas is a 47 y.o. male with medical history significant of hypertension, diabetes mellitus, who was recently hospitalized 3 months ago for testicular abscess which is now resolved, presents to the emergency room with chief complaint of left inguinal area and left thigh redness onset 5 days ago.  His urologist which was seen for his prior scrotal abscess diagnosed with cellulitis and placed on Bactrim.  Despite being on Bactrim he noticed that he is inguinal area and thigh redness has progressively gotten larger and larger.  He did have a temp of 100 few days ago.    Admitted for left thigh cellulitis.  05/09/2018: Patient seen and examined at his bedside.  Redness on his thigh still present and swelling still uncomfortable.  Worse when he walks.   Assessment/Plan: Active Problems:   Cellulitis  Sepsis secondary to severe left thigh cellulitis WBC 20,000, heart rate 109 Sepsis physiology is resolving White count is trending down IV antibiotic changed from clindamycin to IV ceftriaxone Continue to monitor fever curve, WBC Obtain CBC in the morning To note CT of left groin did not show any sign of abscesses  Type 2 diabetes Last A1c 11.0 on 01/27/2018 Repeat A1c Continue insulin sliding scale and Lantus  Obesity BMI 35 Recommend weight loss outpatient  Risks: High risk for decompensation if not treated with IV antibiotics due to the severity of his cellulitis and failed outpatient po antibiotic treatment.  We will continue IV antibiotics at this time and if cellulitis improves will switch to p.o. antibiotics tomorrow 05/10/2018.   Code Status: Full code  Family Communication: None at bedside  Disposition Plan: Home possibly tomorrow 05/10/2018   Consultants:  None  Procedures:  None  Antimicrobials:  IV  ceftriaxone  DVT prophylaxis: Subcu Lovenox   Objective: Vitals:   05/08/18 1311 05/08/18 1446 05/08/18 2046 05/09/18 0533  BP: 122/79 128/79 123/81 122/83  Pulse: (!) 109 (!) 106 (!) 102 96  Resp: 20 12 20 18   Temp: 98.6 F (37 C) 98.5 F (36.9 C) 99.1 F (37.3 C) 98.7 F (37.1 C)  TempSrc: Oral Oral Oral Oral  SpO2: 100% 98% 98% 96%  Weight:      Height:        Intake/Output Summary (Last 24 hours) at 05/09/2018 1440 Last data filed at 05/09/2018 0500 Gross per 24 hour  Intake 994.52 ml  Output 300 ml  Net 694.52 ml   Filed Weights   05/08/18 0904  Weight: 98 kg    Exam:  . General: 47 y.o. year-old male well developed well nourished in no acute distress.  Alert and oriented x3. . Cardiovascular: Regular rate and rhythm with no rubs or gallops.  No thyromegaly or JVD noted.  Marland Kitchen Respiratory: Clear to auscultation with no wheezes or rales. Good inspiratory effort. . Abdomen: Soft nontender nondistended with normal bowel sounds x4 quadrants. . Musculoskeletal: No lower extremity edema. 2/4 pulses in all 4 extremities. . Skin: Large area of erythema, edema and tenderness at the left groin/thigh. . Psychiatry: Mood is appropriate for condition and setting   Data Reviewed: CBC: Recent Labs  Lab 05/08/18 1008 05/09/18 0548  WBC 20.2* 17.6*  NEUTROABS 16.8*  --   HGB 16.2 13.7  HCT 46.1 39.3  MCV 87.1 89.1  PLT 483* 947*   Basic Metabolic Panel: Recent Labs  Lab 05/08/18 1008 05/09/18 0548  NA 138 138  K 3.9 4.0  CL 101 103  CO2 22 27  GLUCOSE 117* 87  BUN 17 13  CREATININE 1.06 1.00  CALCIUM 8.8* 8.1*   GFR: Estimated Creatinine Clearance: 101.5 mL/min (by C-G formula based on SCr of 1 mg/dL). Liver Function Tests: Recent Labs  Lab 05/08/18 1008  AST 18  ALT 15  ALKPHOS 96  BILITOT 0.9  PROT 7.5  ALBUMIN 3.2*   No results for input(s): LIPASE, AMYLASE in the last 168 hours. No results for input(s): AMMONIA in the last 168  hours. Coagulation Profile: No results for input(s): INR, PROTIME in the last 168 hours. Cardiac Enzymes: No results for input(s): CKTOTAL, CKMB, CKMBINDEX, TROPONINI in the last 168 hours. BNP (last 3 results) No results for input(s): PROBNP in the last 8760 hours. HbA1C: No results for input(s): HGBA1C in the last 72 hours. CBG: Recent Labs  Lab 05/08/18 1648 05/08/18 1923 05/09/18 0747 05/09/18 1206  GLUCAP 119* 135* 74 95   Lipid Profile: No results for input(s): CHOL, HDL, LDLCALC, TRIG, CHOLHDL, LDLDIRECT in the last 72 hours. Thyroid Function Tests: No results for input(s): TSH, T4TOTAL, FREET4, T3FREE, THYROIDAB in the last 72 hours. Anemia Panel: No results for input(s): VITAMINB12, FOLATE, FERRITIN, TIBC, IRON, RETICCTPCT in the last 72 hours. Urine analysis:    Component Value Date/Time   COLORURINE YELLOW 01/28/2018 1900   APPEARANCEUR CLEAR 01/28/2018 1900   LABSPEC 1.036 (H) 01/28/2018 1900   PHURINE 5.0 01/28/2018 1900   GLUCOSEU >=500 (A) 01/28/2018 1900   HGBUR NEGATIVE 01/28/2018 1900   BILIRUBINUR NEGATIVE 01/28/2018 1900   KETONESUR 5 (A) 01/28/2018 1900   PROTEINUR NEGATIVE 01/28/2018 1900   NITRITE NEGATIVE 01/28/2018 1900   LEUKOCYTESUR NEGATIVE 01/28/2018 1900   Sepsis Labs: @LABRCNTIP (procalcitonin:4,lacticidven:4)  ) Recent Results (from the past 240 hour(s))  Blood culture (routine x 2)     Status: None (Preliminary result)   Collection Time: 05/08/18  9:50 AM  Result Value Ref Range Status   Specimen Description   Final    BLOOD LEFT ANTECUBITAL Performed at [facility], [place] [address], [place], [place] [address]    Special Requests   Final    BOTTLES DRAWN AEROBIC AND ANAEROBIC Blood Culture adequate volume Performed at [place] [address] [place], [place] [address]    Culture   Final    NO GROWTH 1 DAY Performed at [facility], [place] [address], [place], [place] [address]     Report Status PENDING  Incomplete  Blood culture (routine x 2)     Status: None (Preliminary result)   Collection Time: 05/08/18  9:58 AM  Result Value Ref Range Status   Specimen Description   Final    BLOOD RIGHT ANTECUBITAL Performed at [facility] [address] [place], [place] [address]    Special Requests   Final    BOTTLES DRAWN AEROBIC AND ANAEROBIC Blood Culture adequate volume Performed at [facility] [address], [place], [place] [address]    Culture   Final    NO GROWTH 1 DAY Performed at [facility], [place] [address] [place], [place] [address]    Report Status PENDING  Incomplete  MRSA PCR Screening     Status: None   Collection Time: 05/09/18  8:28 AM  Result Value Ref Range Status   MRSA by PCR NEGATIVE NEGATIVE Final    Comment:  The GeneXpert MRSA Assay (FDA approved for NASAL specimens only), is one component of a comprehensive MRSA colonization surveillance program. It is not intended to diagnose MRSA infection nor to guide or monitor treatment for MRSA infections. Performed at [facility], [place] [address], [place], [place] [address]       Studies: No results found.  Scheduled Meds: . amLODipine  5 mg Oral Daily  . enoxaparin (LOVENOX) injection  40 mg Subcutaneous Q24H  . insulin aspart  0-9 Units Subcutaneous TID WC  . insulin glargine  20 Units Subcutaneous Q2200  . lisinopril  10 mg Oral Daily    Continuous Infusions: . cefTRIAXone (ROCEPHIN)  IV 1 g (05/09/18 1057)     LOS: 0 days     Kayleen Memos, MD [facility] Pager [phone]  If 7PM-7AM, please contact night-coverage [email or web address] Password [email or web address] 05/09/2018, 2:40 PM

## 2018-05-10 ENCOUNTER — Inpatient Hospital Stay (HOSPITAL_COMMUNITY): Payer: 59

## 2018-05-10 LAB — CBC
HCT: 41.1 % (ref 39.0–52.0)
Hemoglobin: 13.9 g/dL (ref 13.0–17.0)
MCH: 30.1 pg (ref 26.0–34.0)
MCHC: 33.8 g/dL (ref 30.0–36.0)
MCV: 89 fL (ref 78.0–100.0)
Platelets: 515 10*3/uL — ABNORMAL HIGH (ref 150–400)
RBC: 4.62 MIL/uL (ref 4.22–5.81)
RDW: 13.5 % (ref 11.5–15.5)
WBC: 15 10*3/uL — AB (ref 4.0–10.5)

## 2018-05-10 LAB — BASIC METABOLIC PANEL
ANION GAP: 9 (ref 5–15)
BUN: 13 mg/dL (ref 6–20)
CHLORIDE: 104 mmol/L (ref 98–111)
CO2: 28 mmol/L (ref 22–32)
Calcium: 8.5 mg/dL — ABNORMAL LOW (ref 8.9–10.3)
Creatinine, Ser: 0.92 mg/dL (ref 0.61–1.24)
GFR calc non Af Amer: >60 mL/min (ref >60)
Glucose, Bld: 92 mg/dL (ref 70–99)
POTASSIUM: 4.3 mmol/L (ref 3.5–5.1)
Sodium: 141 mmol/L (ref 135–145)

## 2018-05-10 LAB — GLUCOSE, CAPILLARY
GLUCOSE-CAPILLARY: 89 mg/dL (ref 70–99)
GLUCOSE-CAPILLARY: 118 mg/dL — AB (ref 70–99)
GLUCOSE-CAPILLARY: 172 mg/dL — AB (ref 70–99)
Glucose-Capillary: 154 mg/dL — ABNORMAL HIGH (ref 70–99)

## 2018-05-10 LAB — LACTIC ACID, PLASMA: LACTIC ACID, VENOUS: 0.7 mmol/L (ref 0.5–1.9)

## 2018-05-10 MED ORDER — INSULIN GLARGINE 100 UNIT/ML ~~LOC~~ SOLN
15.0000 [IU] | Freq: Every day | SUBCUTANEOUS | Status: DC
  Administered 2018-05-10 – 2018-05-13 (×4): 15 [IU] via SUBCUTANEOUS
  Filled 2018-05-10 (×5): qty 0.15

## 2018-05-10 MED ORDER — GADOBUTROL 1 MMOL/ML IV SOLN
9.0000 mL | Freq: Once | INTRAVENOUS | Status: AC | PRN
  Administered 2018-05-10: 9 mL via INTRAVENOUS

## 2018-05-10 NOTE — Progress Notes (Signed)
PROGRESS NOTE  Erik Rojas GBT:[id number] DOB: June 16, 1971 DOA: 05/08/2018 PCP: Billie Ruddy, MD  HPI/Recap of past 24 hours:  Erik Rojas is a 47 y.o. male with medical history significant of hypertension, diabetes mellitus, who was recently hospitalized 3 months ago for testicular abscess which is now resolved, presents to the emergency room with chief complaint of left inguinal area and left thigh redness onset 5 days ago.  His urologist which was seen for his prior scrotal abscess diagnosed with cellulitis and placed on Bactrim.  Despite being on Bactrim he noticed that he is inguinal area and thigh redness has progressively gotten larger and larger.  He did have a temp of 100 few days ago.    Admitted for left thigh cellulitis.  05/09/2018: Patient seen and examined at his bedside.  Redness on his thigh still present and swelling still uncomfortable.  Worse when he walks.  05/10/2018: Patient seen and examined at his bedside.  Still significant redness and edema from his left groin cellulitis.  Negative CT.  MRI ordered to rule out an acute process.   Assessment/Plan: Active Problems:   Cellulitis  Sepsis secondary to severe left thigh cellulitis Leukocytosis persistent  WBC 15,000 and tachycardic with heart rate of 101 Blood cultures drawn on 05/08/2018- to date Continue IV antibiotics ceftriaxone Continue to monitor fever curve, WBC CT of left groin did not show any sign of abscesses Ordered an MRI left groin to rule out an acute process  Type 2 diabetes Last A1c 11.0 on 01/27/2018 Hemoglobin A1c done on 05/09/2018 was 5.2 Avoid hypoglycemia Currently on insulin sliding scale and 20 units Lantus nightly Decrease Lantus dose to 15 units nightly  Obesity BMI 35 Recommend weight loss outpatient  Risks: High risk for decompensation if not treated with IV antibiotics due to the severity of his cellulitis and failed outpatient po antibiotic treatment.   We will continue IV antibiotics at this time and if cellulitis improves will switch to p.o. antibiotics.   Code Status: Full code  Family Communication: None at bedside  Disposition Plan: Home possibly tomorrow 05/10/2018   Consultants:  None  Procedures:  None  Antimicrobials:  IV ceftriaxone  DVT prophylaxis: Subcu Lovenox   Objective: Vitals:   05/09/18 2020 05/10/18 0542 05/10/18 1418 05/10/18 1422  BP: 133/86 132/85 139/88   Pulse: 94 99 (!) 101 100  Resp: 18 (!) 22 12 13   Temp: 98.7 F (37.1 C) 97.7 F (36.5 C) 99.4 F (37.4 C)   TempSrc: Oral Oral Oral   SpO2: 96% 99% 98%   Weight:      Height:        Intake/Output Summary (Last 24 hours) at 05/10/2018 1545 Last data filed at 05/10/2018 7106 Gross per 24 hour  Intake 240 ml  Output 1075 ml  Net -835 ml   Filed Weights   05/08/18 0904  Weight: 98 kg    Exam:  . General: 47 y.o. year-old male developed well-nourished.  Alert and oriented x3. . Cardiovascular: Regular rate and rhythm with no rubs or gallops.  No JVD or thyromegaly noted. Marland Kitchen Respiratory: Clear to auscultation with no wheezes or rales. Good inspiratory effort. . Abdomen: Soft nontender nondistended with normal bowel sounds x4 quadrants. . Musculoskeletal: No lower extremity edema. 2/4 pulses in all 4 extremities. . Skin: Large area of erythema, edema and tenderness at the left groin/thigh. . Psychiatry: Mood is appropriate for condition and setting   Data Reviewed: CBC: Recent Labs  Lab 05/08/18  1008 05/09/18 0548 05/10/18 0551  WBC 20.2* 17.6* 15.0*  NEUTROABS 16.8*  --   --   HGB 16.2 13.7 13.9  HCT 46.1 39.3 41.1  MCV 87.1 89.1 89.0  PLT 483* 464* 622*   Basic Metabolic Panel: Recent Labs  Lab 05/08/18 1008 05/09/18 0548 05/10/18 0551  NA 138 138 141  K 3.9 4.0 4.3  CL 101 103 104  CO2 22 27 28   GLUCOSE 117* 87 92  BUN 17 13 13   CREATININE 1.06 1.00 0.92  CALCIUM 8.8* 8.1* 8.5*   GFR: Estimated Creatinine  Clearance: 110.3 mL/min (by C-G formula based on SCr of 0.92 mg/dL). Liver Function Tests: Recent Labs  Lab 05/08/18 1008  AST 18  ALT 15  ALKPHOS 96  BILITOT 0.9  PROT 7.5  ALBUMIN 3.2*   No results for input(s): LIPASE, AMYLASE in the last 168 hours. No results for input(s): AMMONIA in the last 168 hours. Coagulation Profile: No results for input(s): INR, PROTIME in the last 168 hours. Cardiac Enzymes: No results for input(s): CKTOTAL, CKMB, CKMBINDEX, TROPONINI in the last 168 hours. BNP (last 3 results) No results for input(s): PROBNP in the last 8760 hours. HbA1C: Recent Labs    05/09/18 0548  HGBA1C 5.2   CBG: Recent Labs  Lab 05/09/18 1206 05/09/18 1703 05/09/18 2035 05/10/18 0805 05/10/18 1142  GLUCAP 95 113* 183* 89 118*   Lipid Profile: No results for input(s): CHOL, HDL, LDLCALC, TRIG, CHOLHDL, LDLDIRECT in the last 72 hours. Thyroid Function Tests: No results for input(s): TSH, T4TOTAL, FREET4, T3FREE, THYROIDAB in the last 72 hours. Anemia Panel: No results for input(s): VITAMINB12, FOLATE, FERRITIN, TIBC, IRON, RETICCTPCT in the last 72 hours. Urine analysis:    Component Value Date/Time   COLORURINE YELLOW 01/28/2018 1900   APPEARANCEUR CLEAR 01/28/2018 1900   LABSPEC 1.036 (H) 01/28/2018 1900   PHURINE 5.0 01/28/2018 1900   GLUCOSEU >=500 (A) 01/28/2018 1900   HGBUR NEGATIVE 01/28/2018 1900   BILIRUBINUR NEGATIVE 01/28/2018 1900   KETONESUR 5 (A) 01/28/2018 1900   PROTEINUR NEGATIVE 01/28/2018 1900   NITRITE NEGATIVE 01/28/2018 1900   LEUKOCYTESUR NEGATIVE 01/28/2018 1900   Sepsis Labs: @LABRCNTIP (procalcitonin:4,lacticidven:4)  ) Recent Results (from the past 240 hour(s))  Blood culture (routine x 2)     Status: None (Preliminary result)   Collection Time: 05/08/18  9:50 AM  Result Value Ref Range Status   Specimen Description   Final    BLOOD LEFT ANTECUBITAL Performed at [facility], [place] [address]  [place], [place] [address]    Special Requests   Final    BOTTLES DRAWN AEROBIC AND ANAEROBIC Blood Culture adequate volume Performed at [place] [address], [place], [place] [address]    Culture   Final    NO GROWTH 2 DAYS Performed at [facility] [address] [place], [place] [address]    Report Status PENDING  Incomplete  Blood culture (routine x 2)     Status: None (Preliminary result)   Collection Time: 05/08/18  9:58 AM  Result Value Ref Range Status   Specimen Description   Final    BLOOD RIGHT ANTECUBITAL Performed at [facility] [address] [place], [place] [address]    Special Requests   Final    BOTTLES DRAWN AEROBIC AND ANAEROBIC Blood Culture adequate volume Performed at [facility] [address], [place], [place] [address]    Culture   Final  NO GROWTH 2 DAYS Performed at [facility], [place] [address] [place], [place] [address]    Report Status PENDING  Incomplete  MRSA PCR Screening     Status: None   Collection Time: 05/09/18  8:28 AM  Result Value Ref Range Status   MRSA by PCR NEGATIVE NEGATIVE Final    Comment:        The GeneXpert MRSA Assay (FDA approved for NASAL specimens only), is one component of a comprehensive MRSA colonization surveillance program. It is not intended to diagnose MRSA infection nor to guide or monitor treatment for MRSA infections. Performed at [facility], [place] [address] [place], [place] [address]       Studies: No results found.  Scheduled Meds: . amLODipine  5 mg Oral Daily  . enoxaparin (LOVENOX) injection  40 mg Subcutaneous Q24H  . insulin aspart  0-9 Units Subcutaneous TID WC  . insulin glargine  20 Units Subcutaneous Q2200  . lisinopril  10 mg Oral Daily    Continuous Infusions: . cefTRIAXone (ROCEPHIN)  IV 1 g (05/10/18 1030)     LOS: 1 day     Kayleen Memos, MD [facility] Pager  [phone]  If 7PM-7AM, please contact night-coverage [email or web address] Password [facility] 05/10/2018, 3:45 PM

## 2018-05-11 LAB — GLUCOSE, CAPILLARY
GLUCOSE-CAPILLARY: 125 mg/dL — AB (ref 70–99)
GLUCOSE-CAPILLARY: 180 mg/dL — AB (ref 70–99)
Glucose-Capillary: 140 mg/dL — ABNORMAL HIGH (ref 70–99)
Glucose-Capillary: 142 mg/dL — ABNORMAL HIGH (ref 70–99)

## 2018-05-11 LAB — CBC
HCT: 43.3 % (ref 39.0–52.0)
Hemoglobin: 14.7 g/dL (ref 13.0–17.0)
MCH: 30.3 pg (ref 26.0–34.0)
MCHC: 33.9 g/dL (ref 30.0–36.0)
MCV: 89.3 fL (ref 78.0–100.0)
PLATELETS: 566 10*3/uL — AB (ref 150–400)
RBC: 4.85 MIL/uL (ref 4.22–5.81)
RDW: 13.4 % (ref 11.5–15.5)
WBC: 15.3 10*3/uL — AB (ref 4.0–10.5)

## 2018-05-11 MED ORDER — METRONIDAZOLE IN NACL 5-0.79 MG/ML-% IV SOLN
500.0000 mg | Freq: Four times a day (QID) | INTRAVENOUS | Status: DC
  Administered 2018-05-11 – 2018-05-13 (×9): 500 mg via INTRAVENOUS
  Filled 2018-05-11 (×9): qty 100

## 2018-05-11 MED ORDER — SODIUM CHLORIDE 0.9 % IV SOLN
2.0000 g | INTRAVENOUS | Status: DC
  Administered 2018-05-11 – 2018-05-13 (×3): 2 g via INTRAVENOUS
  Filled 2018-05-11 (×3): qty 2

## 2018-05-11 NOTE — Consult Note (Signed)
[name] Consult/Admission Note  Erik Rojas 28-Mar-1971  [id number].    Requesting MD: Nevada Crane Chief Complaint/Reason for Consult: cellulitis and abscess of left thigh  HPI:   Pt is a 47 yo male diagnosed with type II DM 4 months ago and recently had an I&D in the ER for a scrotal abscess (06/13). He saw urology last Wednesday for a follow up and he told the urologist about the lesion on his left thigh. He was prescribed Bactrim. The swelling and redness increased which prompted him to come to the ER on Sunday. CT scan did not show an abscess but MRI did. He states pain with touch but no pain at rest. He just ate breakfast. No hx of any surgeries and pt is not anticoagulated. He works as a Land.   ROS:  Review of Systems  Constitutional: Negative for chills, diaphoresis and fever.  HENT: Negative for sore throat.   Respiratory: Negative for cough and shortness of breath.   Cardiovascular: Negative for chest pain.  Gastrointestinal: Negative for abdominal pain, blood in stool, constipation, diarrhea, nausea and vomiting.  Genitourinary: Negative for dysuria.       Pain/swelling/redness of L groin/upper thigh  Skin: Negative for rash.  Neurological: Negative for dizziness and loss of consciousness.  All other systems reviewed and are negative.    Family History  Problem Relation Age of Onset  . Hypertension Mother   . Diabetes Mother   . Diabetes Brother   . Cancer Father   . Cancer Sister     Past Medical History:  Diagnosis Date  . HTN (hypertension)   . Scrotal abscess 01/28/2018   right  . Type II diabetes mellitus ([place]) 01/27/2018   "just dx'd" (01/28/2018)    Past Surgical History:  Procedure Laterality Date  . NO PAST SURGERIES      Social History:  reports that he has never smoked. He has never used smokeless tobacco. He reports that he drinks alcohol. He reports that he does not use drugs.  Allergies: No Known  Allergies  Medications Prior to Admission  Medication Sig Dispense Refill  . acetaminophen (TYLENOL) 325 MG tablet Take 2 tablets (650 mg total) by mouth every 6 (six) hours as needed for mild pain (or Fever >/= 101). 30 tablet 1  . amLODipine (NORVASC) 5 MG tablet Take 1 tablet (5 mg total) by mouth daily. For BP 30 tablet 5  . blood glucose meter kit and supplies Relion Prime or Dispense other brand based on patient and insurance preference. Use up to four times daily as directed. (FOR ICD-9 250.00, 250.01). 1 each 3  . Continuous Blood Gluc Receiver (FREESTYLE LIBRE 14 DAY READER) DEVI 1 each by Does not apply route daily. And PRN. 1 Device 0  . Continuous Blood Gluc Sensor (FREESTYLE LIBRE 14 DAY SENSOR) MISC Place 1 each onto the skin every 14 (fourteen) days. And PRN. 2 each 11  . glucose blood (ONETOUCH VERIO) test strip Use as instructed 100 each 12  . insulin lispro (HUMALOG) 100 UNIT/ML injection Inject 3 Units into the skin 3 (three) times daily before meals.    . Insulin Pen Needle (PEN NEEDLES 3/16") 31G X 5 MM MISC Used to inject insulin with meals as advised- insulin pen needles (#40981), Dx 250.02 100 each 5  . Lancets (ONETOUCH ULTRASOFT) lancets Check glucose three times daily more for Hypoglycemia and Hyperglycemia. 100 each 5  . LANTUS SOLOSTAR 100 UNIT/ML Solostar Pen INJECT 25 UNITS INTO  THE SKIN DAILY AT 10 PM. INSULIN 5 pen 5  . lisinopril (PRINIVIL,ZESTRIL) 10 MG tablet Take 1 tablet (10 mg total) by mouth daily. For BP.... Start on 02/08/18 after you Complete Bactrim (Sulfa) antibiotic 30 tablet 5  . metFORMIN (GLUCOPHAGE) 1000 MG tablet Take 1 tablet (1,000 mg total) by mouth 2 (two) times daily with a meal. For Diabetes 60 tablet 5  . sulfamethoxazole-trimethoprim (BACTRIM DS,SEPTRA DS) 800-160 MG tablet Take 1 tablet by mouth 2 (two) times daily.    . fluconazole (DIFLUCAN) 200 MG tablet Take 1 tablet (200 mg total) by mouth daily. (Patient not taking: Reported on  05/08/2018) 5 tablet 0  . insulin aspart (NOVOLOG) 100 UNIT/ML FlexPen Inject 3 Units into the skin 3 (three) times daily with meals. As long as Glucose is >90 pre-meal. Do NOT take if you do NOT eat (Patient not taking: Reported on 05/08/2018) 15 mL 5    Blood pressure 116/85, pulse 85, temperature 98.6 F (37 C), temperature source Oral, resp. rate 18, height _0  (1.651 m), weight 98 kg, SpO2 99 %.  Physical Exam  Constitutional: He is oriented to person, place, and time. He appears well-developed and well-nourished. No distress.  HENT:  Head: Normocephalic and atraumatic.  Nose: Nose normal.  Mouth/Throat: Oropharynx is clear and moist and mucous membranes are normal. No oropharyngeal exudate.  Eyes: Pupils are equal, round, and reactive to light. Conjunctivae are normal. Right eye exhibits no discharge. Left eye exhibits no discharge. No scleral icterus.  Neck: Normal range of motion. Neck supple. No thyromegaly present.  Cardiovascular: Normal rate, regular rhythm and normal heart sounds.  No murmur heard. Pulmonary/Chest: Effort normal and breath sounds normal. No respiratory distress. He has no wheezes. He has no rhonchi. He has no rales.  Abdominal: Soft. Normal appearance and bowel sounds are normal. He exhibits no distension. There is no hepatosplenomegaly. There is no tenderness. There is no rigidity and no guarding.  Genitourinary: Testes normal. Right testis shows no swelling and no tenderness. Left testis shows no swelling and no tenderness.     Genitourinary Comments: Large area of induration, erythema and increased warmth of the left upper thigh/groin. No scrotal involvement. TTP. See photo below  Musculoskeletal: Normal range of motion. He exhibits no edema, tenderness or deformity.  Lymphadenopathy:    He has no cervical adenopathy.  Neurological: He is alert and oriented to person, place, and time.  Skin: Skin is warm and dry. No rash noted. He is not diaphoretic.    Psychiatric: He has a normal mood and affect.  Nursing note and vitals reviewed.      Results for orders placed or performed during the hospital encounter of 05/08/18 (from the past 48 hour(s))  Glucose, capillary     Status: None   Collection Time: 05/09/18 12:06 PM  Result Value Ref Range   Glucose-Capillary 95 70 - 99 mg/dL  Glucose, capillary     Status: Abnormal   Collection Time: 05/09/18  5:03 PM  Result Value Ref Range   Glucose-Capillary 113 (H) 70 - 99 mg/dL  Glucose, capillary     Status: Abnormal   Collection Time: 05/09/18  8:35 PM  Result Value Ref Range   Glucose-Capillary 183 (H) 70 - 99 mg/dL  CBC     Status: Abnormal   Collection Time: 05/10/18  5:51 AM  Result Value Ref Range   WBC 15.0 (H) 4.0 - 10.5 K/uL   RBC 4.62 4.22 - 5.81 MIL/uL  Hemoglobin 13.9 13.0 - 17.0 g/dL   HCT 41.1 39.0 - 52.0 %   MCV 89.0 78.0 - 100.0 fL   MCH 30.1 26.0 - 34.0 pg   MCHC 33.8 30.0 - 36.0 g/dL   RDW 13.5 11.5 - 15.5 %   Platelets 515 (H) 150 - 400 K/uL    Comment: Performed at [facility], [place] [address] [place], [place] [address]  Basic metabolic panel     Status: Abnormal   Collection Time: 05/10/18  5:51 AM  Result Value Ref Range   Sodium 141 135 - 145 mmol/L   Potassium 4.3 3.5 - 5.1 mmol/L   Chloride 104 98 - 111 mmol/L   CO2 28 22 - 32 mmol/L   Glucose, Bld 92 70 - 99 mg/dL   BUN 13 6 - 20 mg/dL   Creatinine, Ser 0.92 0.61 - 1.24 mg/dL   Calcium 8.5 (L) 8.9 - 10.3 mg/dL   GFR calc non Af Amer >60 >60 mL/min   GFR calc Af Amer >60 >60 mL/min    Comment: (NOTE) The eGFR has been calculated using the CKD EPI equation. This calculation has not been validated in all clinical situations. eGFR's persistently <60 mL/min signify possible Chronic Kidney Disease.    Anion gap 9 5 - 15    Comment: Performed at [facility], [place] [address], [place], [place] [address]  Lactic acid, plasma     Status: None   Collection Time:  05/10/18  5:51 AM  Result Value Ref Range   Lactic Acid, Venous 0.7 0.5 - 1.9 mmol/L    Comment: Performed at [facility], [place] [address], [place], [place] [address]  Glucose, capillary     Status: None   Collection Time: 05/10/18  8:05 AM  Result Value Ref Range   Glucose-Capillary 89 70 - 99 mg/dL  Glucose, capillary     Status: Abnormal   Collection Time: 05/10/18 11:42 AM  Result Value Ref Range   Glucose-Capillary 118 (H) 70 - 99 mg/dL  Glucose, capillary     Status: Abnormal   Collection Time: 05/10/18  4:54 PM  Result Value Ref Range   Glucose-Capillary 172 (H) 70 - 99 mg/dL  Glucose, capillary     Status: Abnormal   Collection Time: 05/10/18  8:31 PM  Result Value Ref Range   Glucose-Capillary 154 (H) 70 - 99 mg/dL  CBC     Status: Abnormal   Collection Time: 05/11/18  5:57 AM  Result Value Ref Range   WBC 15.3 (H) 4.0 - 10.5 K/uL   RBC 4.85 4.22 - 5.81 MIL/uL   Hemoglobin 14.7 13.0 - 17.0 g/dL   HCT 43.3 39.0 - 52.0 %   MCV 89.3 78.0 - 100.0 fL   MCH 30.3 26.0 - 34.0 pg   MCHC 33.9 30.0 - 36.0 g/dL   RDW 13.4 11.5 - 15.5 %   Platelets 566 (H) 150 - 400 K/uL    Comment: Performed at [facility], [place] [address], [place], [place] [address]  Glucose, capillary     Status: Abnormal   Collection Time: 05/11/18  7:45 AM  Result Value Ref Range   Glucose-Capillary 125 (H) 70 - 99 mg/dL   Mr Pelvis W Wo Contrast  Result Date: 05/10/2018 CLINICAL DATA:  Left groin infection EXAM: MRI PELVIS WITHOUT AND WITH CONTRAST TECHNIQUE: Multiplanar multisequence MR imaging of the pelvis was performed both before and after administration of intravenous contrast. CONTRAST:  9 mL Gadavist COMPARISON:  None. FINDINGS: Urinary Tract: The bladder is unremarkable for the degree of distention. No intraluminal mass. Hydroureter. Bowel:  Unremarkable visualized pelvic bowel loops. Vascular/Lymphatic: Enlarged left inguinal nodes measuring up to 13 mm short  axis likely reactive. Reproductive: Small fat containing inguinal hernia. Prostate and seminal vesicles are unremarkable. Testicles demonstrate no acute abnormality or mass. Other:  None Musculoskeletal: Diffuse subcutaneous soft tissue edema consistent with cellulitis of the left hip and thigh is noted with smaller subcutaneous foci of abnormal enhancement along the anterior aspect of the left hip tracking inferomedial along the medial left thigh, the greatest collection measuring at least 7.8 x 2.5 x 6 cm. Mild myositis of the abductor and pectineus muscles without definite evidence of pyomyositis. No marrow signal abnormalities to suggest osteomyelitis. No fracture. IMPRESSION: 1. Moderate degree of inflammatory soft tissue thickening and edema of the left hip and thigh with tracking fluid along the anteromedial aspect of the thigh, the greatest collection measuring 7.8 x 2.5 x 6 cm medially consistent with soft tissue abscesses. 2. Myositis of the adductor and pectineus muscles on the left. Electronically Signed   By: Ashley Royalty M.D.   On: 05/10/2018 18:58      Assessment/Plan Active Problems:   Cellulitis  Type II DM - per medicine  Cellulitis & abscess left upper thigh/groin - needs I&D in OR but pt ate breakfast, will take to OR tomorrow for surgery  FEN: carb mod, NPO at midnight VTE: SCD's, lovenox ID: Clindamycin 09/22-09/23, Vanc once 09/22;  Rocephin 09/23 >> Flagyl 09/25>> WBC 15.3 Foley: none Follow up: TBD  Plan: OR tomorrow for I&D, NPO at midnight   Kalman Drape, [facility] 05/11/2018, 9:18 AM Pager: [phone] Consults: [phone] Mon-Fri 7:00 am-4:30 pm Sat-Sun 7:00 am-11:30 am

## 2018-05-11 NOTE — Progress Notes (Signed)
PROGRESS NOTE  Erik Rojas IWP:[id number] DOB: [birth year] DOA: 05/08/2018 PCP: Billie Ruddy, MD  HPI/Recap of past 24 hours:  Erik Rojas is a 47 y.o. male with medical history significant of hypertension, diabetes mellitus, who was recently hospitalized 3 months ago for testicular abscess which is now resolved, presents to the emergency room with chief complaint of left inguinal area and left thigh redness onset 5 days ago.  His urologist which was seen for his prior scrotal abscess diagnosed with cellulitis and placed on Bactrim.  Despite being on Bactrim he noticed that he is inguinal area and thigh redness has progressively gotten larger and larger.  He did have a temp of 100 few days ago.    Admitted for left thigh cellulitis. -05/09/2018: Redness on his thigh still present and swelling still uncomfortable.  Worse when he walks. -05/10/2018: Still significant redness and edema from his left groin cellulitis.  Negative CT.  MRI revealed abscesses and myositis in the left groin.  05/11/2018: Patient seen and examined at bedside.  No acute events overnight.  Reports pain in his left groin when palpated/examined. Leukocytosis is persistent.  General surgery consulted.  Possible I&D in the OR tomorrow 05/12/2018.  N.p.o. after midnight.   Assessment/Plan: Active Problems:   Cellulitis  Sepsis secondary to severe left thigh cellulitis complicated by abscesses and myositis Leukocytosis persistent  WBC 15,000 and tachycardic with heart rate of 101 Blood cultures drawn on 05/08/2018 negative to date Continue IV antibiotics ceftriaxone Continue to monitor fever curve, WBC CT of left groin did not show any sign of abscesses MRI revealed multiple abscesses in the left groin and myositis General surgery and plan for possible I&D tomorrow 05/12/2018 Added IV Flagyl to IV ceftriaxone in order to cover anaerobes Obtain CBC in the morning  Type 2 diabetes, tightly  controlled Last A1c 11.0 on 01/27/2018 Hemoglobin A1c done on 05/09/2018 was 5.2 Avoid hypoglycemia Currently on insulin sliding scale and 20 units Lantus nightly Decrease Lantus dose to 15 units nightly Continue Lantus 15 units nightly  Obesity BMI 35 Recommend weight loss outpatient  Risks: High risk for decompensation if not treated with IV antibiotics due to the severity of his cellulitis and failed outpatient po antibiotic treatment.  No findings of left groin abscesses and myositis.  We will continue IV antibiotics at this time.  Possible I&D tomorrow 05/12/2018 by general surgery.   Code Status: Full code  Family Communication: None at bedside  Disposition Plan: Home possibly tomorrow 05/12/2018 or when general surgery signs off.   Consultants:  General surgery  Procedures:  None  Antimicrobials:  IV ceftriaxone  IV Flagyl  DVT prophylaxis: Subcu Lovenox   Objective: Vitals:   05/10/18 1418 05/10/18 1422 05/10/18 2019 05/11/18 0530  BP: 139/88  134/89 116/85  Pulse: (!) 101 100 97 85  Resp: 12 13 (!) 23 18  Temp: 99.4 F (37.4 C)  99.4 F (37.4 C) 98.6 F (37 C)  TempSrc: Oral  Oral Oral  SpO2: 98%  94% 99%  Weight:      Height:        Intake/Output Summary (Last 24 hours) at 05/11/2018 1316 Last data filed at 05/11/2018 1215 Gross per 24 hour  Intake 240 ml  Output 850 ml  Net -610 ml   Filed Weights   05/08/18 0904  Weight: 98 kg    Exam:  . General: 47 y.o. year-old male well-developed well-nourished in no acute distress.  Alert and oriented x3. . Cardiovascular:  Regular rate and rhythm with no rubs or gallops.  No JVD or thyromegaly noted. Marland Kitchen Respiratory: Clear to auscultation with no wheezes or rales. Good inspiratory effort. . Abdomen: Soft nontender nondistended with normal bowel sounds x4 quadrants. . Musculoskeletal: No lower extremity edema. 2/4 pulses in all 4 extremities. . Skin: Large area of erythema, edema and tenderness at the  left groin/thigh. . Psychiatry: Mood is appropriate for condition and setting   Data Reviewed: CBC: Recent Labs  Lab 05/08/18 1008 05/09/18 0548 05/10/18 0551 05/11/18 0557  WBC 20.2* 17.6* 15.0* 15.3*  NEUTROABS 16.8*  --   --   --   HGB 16.2 13.7 13.9 14.7  HCT 46.1 39.3 41.1 43.3  MCV 87.1 89.1 89.0 89.3  PLT 483* 464* 515* 671*   Basic Metabolic Panel: Recent Labs  Lab 05/08/18 1008 05/09/18 0548 05/10/18 0551  NA 138 138 141  K 3.9 4.0 4.3  CL 101 103 104  CO2 22 27 28   GLUCOSE 117* 87 92  BUN 17 13 13   CREATININE 1.06 1.00 0.92  CALCIUM 8.8* 8.1* 8.5*   GFR: Estimated Creatinine Clearance: 110.3 mL/min (by C-G formula based on SCr of 0.92 mg/dL). Liver Function Tests: Recent Labs  Lab 05/08/18 1008  AST 18  ALT 15  ALKPHOS 96  BILITOT 0.9  PROT 7.5  ALBUMIN 3.2*   No results for input(s): LIPASE, AMYLASE in the last 168 hours. No results for input(s): AMMONIA in the last 168 hours. Coagulation Profile: No results for input(s): INR, PROTIME in the last 168 hours. Cardiac Enzymes: No results for input(s): CKTOTAL, CKMB, CKMBINDEX, TROPONINI in the last 168 hours. BNP (last 3 results) No results for input(s): PROBNP in the last 8760 hours. HbA1C: Recent Labs    05/09/18 0548  HGBA1C 5.2   CBG: Recent Labs  Lab 05/10/18 1142 05/10/18 1654 05/10/18 2031 05/11/18 0745 05/11/18 1213  GLUCAP 118* 172* 154* 125* 140*   Lipid Profile: No results for input(s): CHOL, HDL, LDLCALC, TRIG, CHOLHDL, LDLDIRECT in the last 72 hours. Thyroid Function Tests: No results for input(s): TSH, T4TOTAL, FREET4, T3FREE, THYROIDAB in the last 72 hours. Anemia Panel: No results for input(s): VITAMINB12, FOLATE, FERRITIN, TIBC, IRON, RETICCTPCT in the last 72 hours. Urine analysis:    Component Value Date/Time   COLORURINE YELLOW 01/28/2018 1900   APPEARANCEUR CLEAR 01/28/2018 1900   LABSPEC 1.036 (H) 01/28/2018 1900   PHURINE 5.0 01/28/2018 1900   GLUCOSEU  >=500 (A) 01/28/2018 1900   HGBUR NEGATIVE 01/28/2018 1900   BILIRUBINUR NEGATIVE 01/28/2018 1900   KETONESUR 5 (A) 01/28/2018 1900   PROTEINUR NEGATIVE 01/28/2018 1900   NITRITE NEGATIVE 01/28/2018 1900   LEUKOCYTESUR NEGATIVE 01/28/2018 1900   Sepsis Labs: @LABRCNTIP (procalcitonin:4,lacticidven:4)  ) Recent Results (from the past 240 hour(s))  Blood culture (routine x 2)     Status: None (Preliminary result)   Collection Time: 05/08/18  9:50 AM  Result Value Ref Range Status   Specimen Description   Final    BLOOD LEFT ANTECUBITAL Performed at [facility], [place] [address], [place], [place] [address]    Special Requests   Final    BOTTLES DRAWN AEROBIC AND ANAEROBIC Blood Culture adequate volume Performed at [place] [address] [place], [place] [address]    Culture   Final    NO GROWTH 2 DAYS Performed at [place] [address], [place], [place] [address]    Report Status PENDING  Incomplete  Blood culture (routine x 2)     Status: None (Preliminary result)   Collection Time: 05/08/18  9:58 AM  Result Value Ref Range Status   Specimen Description   Final    BLOOD RIGHT ANTECUBITAL Performed at [place] [address] [place], [place] [address]    Special Requests   Final    BOTTLES DRAWN AEROBIC AND ANAEROBIC Blood Culture adequate volume Performed at [place] [address], [place], [place] [address]    Culture   Final    NO GROWTH 2 DAYS Performed at [place] [address] [place], [place] [address]    Report Status PENDING  Incomplete  MRSA PCR Screening     Status: None   Collection Time: 05/09/18  8:28 AM  Result Value Ref Range Status   MRSA by PCR NEGATIVE NEGATIVE Final    Comment:        The GeneXpert MRSA Assay (FDA approved for NASAL specimens only), is one component of a comprehensive MRSA colonization surveillance program. It is  not intended to diagnose MRSA infection nor to guide or monitor treatment for MRSA infections. Performed at [facility], [place] [address], [place], [place] [address]       Studies: Mr Pelvis W Wo Contrast  Result Date: 05/10/2018 CLINICAL DATA:  Left groin infection EXAM: MRI PELVIS WITHOUT AND WITH CONTRAST TECHNIQUE: Multiplanar multisequence MR imaging of the pelvis was performed both before and after administration of intravenous contrast. CONTRAST:  9 mL Gadavist COMPARISON:  None. FINDINGS: Urinary Tract: The bladder is unremarkable for the degree of distention. No intraluminal mass. Hydroureter. Bowel:  Unremarkable visualized pelvic bowel loops. Vascular/Lymphatic: Enlarged left inguinal nodes measuring up to 13 mm short axis likely reactive. Reproductive: Small fat containing inguinal hernia. Prostate and seminal vesicles are unremarkable. Testicles demonstrate no acute abnormality or mass. Other:  None Musculoskeletal: Diffuse subcutaneous soft tissue edema consistent with cellulitis of the left hip and thigh is noted with smaller subcutaneous foci of abnormal enhancement along the anterior aspect of the left hip tracking inferomedial along the medial left thigh, the greatest collection measuring at least 7.8 x 2.5 x 6 cm. Mild myositis of the abductor and pectineus muscles without definite evidence of pyomyositis. No marrow signal abnormalities to suggest osteomyelitis. No fracture. IMPRESSION: 1. Moderate degree of inflammatory soft tissue thickening and edema of the left hip and thigh with tracking fluid along the anteromedial aspect of the thigh, the greatest collection measuring 7.8 x 2.5 x 6 cm medially consistent with soft tissue abscesses. 2. Myositis of the adductor and pectineus muscles on the left. Electronically Signed   By: Ashley Royalty M.D.   On: 05/10/2018 18:58    Scheduled Meds: . amLODipine  5 mg Oral Daily  . enoxaparin (LOVENOX) injection  40 mg  Subcutaneous Q24H  . insulin aspart  0-9 Units Subcutaneous TID WC  . insulin glargine  15 Units Subcutaneous Q2200  . lisinopril  10 mg Oral Daily    Continuous Infusions: . cefTRIAXone (ROCEPHIN)  IV 2 g (05/11/18 0736)  . metronidazole 500 mg (05/11/18 1217)     LOS: 2 days     Kayleen Memos, MD [facility] Pager [phone]  If 7PM-7AM, please contact night-coverage [email or web address] Password [email or web address] 05/11/2018, 1:16 PM

## 2018-05-11 NOTE — H&P (View-Only) (Signed)
[name] Consult/Admission Note  Erik Rojas 28-Mar-1971  [id number].    Requesting MD: [place] Crane Chief Complaint/Reason for Consult: cellulitis and abscess of left thigh  HPI:   Pt is a 47 yo male diagnosed with type II DM 4 months ago and recently had an I&D in the ER for a scrotal abscess (06/13). He saw urology last Wednesday for a follow up and he told the urologist about the lesion on his left thigh. He was prescribed Bactrim. The swelling and redness increased which prompted him to come to the ER on Sunday. CT scan did not show an abscess but MRI did. He states pain with touch but no pain at rest. He just ate breakfast. No hx of any surgeries and pt is not anticoagulated. He works as a Land.   ROS:  Review of Systems  Constitutional: Negative for chills, diaphoresis and fever.  HENT: Negative for sore throat.   Respiratory: Negative for cough and shortness of breath.   Cardiovascular: Negative for chest pain.  Gastrointestinal: Negative for abdominal pain, blood in stool, constipation, diarrhea, nausea and vomiting.  Genitourinary: Negative for dysuria.       Pain/swelling/redness of L groin/upper thigh  Skin: Negative for rash.  Neurological: Negative for dizziness and loss of consciousness.  All other systems reviewed and are negative.    Family History  Problem Relation Age of Onset  . Hypertension Mother   . Diabetes Mother   . Diabetes Brother   . Cancer Father   . Cancer Sister     Past Medical History:  Diagnosis Date  . HTN (hypertension)   . Scrotal abscess 01/28/2018   right  . Type II diabetes mellitus ([place]) 01/27/2018   "just dx'd" (01/28/2018)    Past Surgical History:  Procedure Laterality Date  . NO PAST SURGERIES      Social History:  reports that he has never smoked. He has never used smokeless tobacco. He reports that he drinks alcohol. He reports that he does not use drugs.  Allergies: No Known  Allergies  Medications Prior to Admission  Medication Sig Dispense Refill  . acetaminophen (TYLENOL) 325 MG tablet Take 2 tablets (650 mg total) by mouth every 6 (six) hours as needed for mild pain (or Fever >/= 101). 30 tablet 1  . amLODipine (NORVASC) 5 MG tablet Take 1 tablet (5 mg total) by mouth daily. For BP 30 tablet 5  . blood glucose meter kit and supplies Relion Prime or Dispense other brand based on patient and insurance preference. Use up to four times daily as directed. (FOR ICD-9 250.00, 250.01). 1 each 3  . Continuous Blood Gluc Receiver (FREESTYLE LIBRE 14 DAY READER) DEVI 1 each by Does not apply route daily. And PRN. 1 Device 0  . Continuous Blood Gluc Sensor (FREESTYLE LIBRE 14 DAY SENSOR) MISC Place 1 each onto the skin every 14 (fourteen) days. And PRN. 2 each 11  . glucose blood (ONETOUCH VERIO) test strip Use as instructed 100 each 12  . insulin lispro (HUMALOG) 100 UNIT/ML injection Inject 3 Units into the skin 3 (three) times daily before meals.    . Insulin Pen Needle (PEN NEEDLES 3/16") 31G X 5 MM MISC Used to inject insulin with meals as advised- insulin pen needles (#40981), Dx 250.02 100 each 5  . Lancets (ONETOUCH ULTRASOFT) lancets Check glucose three times daily more for Hypoglycemia and Hyperglycemia. 100 each 5  . LANTUS SOLOSTAR 100 UNIT/ML Solostar Pen INJECT 25 UNITS INTO  THE SKIN DAILY AT 10 PM. INSULIN 5 pen 5  . lisinopril (PRINIVIL,ZESTRIL) 10 MG tablet Take 1 tablet (10 mg total) by mouth daily. For BP.... Start on 02/08/18 after you Complete Bactrim (Sulfa) antibiotic 30 tablet 5  . metFORMIN (GLUCOPHAGE) 1000 MG tablet Take 1 tablet (1,000 mg total) by mouth 2 (two) times daily with a meal. For Diabetes 60 tablet 5  . sulfamethoxazole-trimethoprim (BACTRIM DS,SEPTRA DS) 800-160 MG tablet Take 1 tablet by mouth 2 (two) times daily.    . fluconazole (DIFLUCAN) 200 MG tablet Take 1 tablet (200 mg total) by mouth daily. (Patient not taking: Reported on  05/08/2018) 5 tablet 0  . insulin aspart (NOVOLOG) 100 UNIT/ML FlexPen Inject 3 Units into the skin 3 (three) times daily with meals. As long as Glucose is >90 pre-meal. Do NOT take if you do NOT eat (Patient not taking: Reported on 05/08/2018) 15 mL 5    Blood pressure 116/85, pulse 85, temperature 98.6 F (37 C), temperature source Oral, resp. rate 18, height _0  (1.651 m), weight 98 kg, SpO2 99 %.  Physical Exam  Constitutional: He is oriented to person, place, and time. He appears well-developed and well-nourished. No distress.  HENT:  Head: Normocephalic and atraumatic.  Nose: Nose normal.  Mouth/Throat: Oropharynx is clear and moist and mucous membranes are normal. No oropharyngeal exudate.  Eyes: Pupils are equal, round, and reactive to light. Conjunctivae are normal. Right eye exhibits no discharge. Left eye exhibits no discharge. No scleral icterus.  Neck: Normal range of motion. Neck supple. No thyromegaly present.  Cardiovascular: Normal rate, regular rhythm and normal heart sounds.  No murmur heard. Pulmonary/Chest: Effort normal and breath sounds normal. No respiratory distress. He has no wheezes. He has no rhonchi. He has no rales.  Abdominal: Soft. Normal appearance and bowel sounds are normal. He exhibits no distension. There is no hepatosplenomegaly. There is no tenderness. There is no rigidity and no guarding.  Genitourinary: Testes normal. Right testis shows no swelling and no tenderness. Left testis shows no swelling and no tenderness.     Genitourinary Comments: Large area of induration, erythema and increased warmth of the left upper thigh/groin. No scrotal involvement. TTP. See photo below  Musculoskeletal: Normal range of motion. He exhibits no edema, tenderness or deformity.  Lymphadenopathy:    He has no cervical adenopathy.  Neurological: He is alert and oriented to person, place, and time.  Skin: Skin is warm and dry. No rash noted. He is not diaphoretic.    Psychiatric: He has a normal mood and affect.  Nursing note and vitals reviewed.      Results for orders placed or performed during the hospital encounter of 05/08/18 (from the past 48 hour(s))  Glucose, capillary     Status: None   Collection Time: 05/09/18 12:06 PM  Result Value Ref Range   Glucose-Capillary 95 70 - 99 mg/dL  Glucose, capillary     Status: Abnormal   Collection Time: 05/09/18  5:03 PM  Result Value Ref Range   Glucose-Capillary 113 (H) 70 - 99 mg/dL  Glucose, capillary     Status: Abnormal   Collection Time: 05/09/18  8:35 PM  Result Value Ref Range   Glucose-Capillary 183 (H) 70 - 99 mg/dL  CBC     Status: Abnormal   Collection Time: 05/10/18  5:51 AM  Result Value Ref Range   WBC 15.0 (H) 4.0 - 10.5 K/uL   RBC 4.62 4.22 - 5.81 MIL/uL  Hemoglobin 13.9 13.0 - 17.0 g/dL   HCT 41.1 39.0 - 52.0 %   MCV 89.0 78.0 - 100.0 fL   MCH 30.1 26.0 - 34.0 pg   MCHC 33.8 30.0 - 36.0 g/dL   RDW 13.5 11.5 - 15.5 %   Platelets 515 (H) 150 - 400 K/uL    Comment: Performed at [facility], [place] [address] [place], [place] [address]  Basic metabolic panel     Status: Abnormal   Collection Time: 05/10/18  5:51 AM  Result Value Ref Range   Sodium 141 135 - 145 mmol/L   Potassium 4.3 3.5 - 5.1 mmol/L   Chloride 104 98 - 111 mmol/L   CO2 28 22 - 32 mmol/L   Glucose, Bld 92 70 - 99 mg/dL   BUN 13 6 - 20 mg/dL   Creatinine, Ser 0.92 0.61 - 1.24 mg/dL   Calcium 8.5 (L) 8.9 - 10.3 mg/dL   GFR calc non Af Amer >60 >60 mL/min   GFR calc Af Amer >60 >60 mL/min    Comment: (NOTE) The eGFR has been calculated using the CKD EPI equation. This calculation has not been validated in all clinical situations. eGFR's persistently <60 mL/min signify possible Chronic Kidney Disease.    Anion gap 9 5 - 15    Comment: Performed at [facility], [place] [address], [place], [place] [address]  Lactic acid, plasma     Status: None   Collection Time:  05/10/18  5:51 AM  Result Value Ref Range   Lactic Acid, Venous 0.7 0.5 - 1.9 mmol/L    Comment: Performed at [facility], [place] [address], [place], [place] [address]  Glucose, capillary     Status: None   Collection Time: 05/10/18  8:05 AM  Result Value Ref Range   Glucose-Capillary 89 70 - 99 mg/dL  Glucose, capillary     Status: Abnormal   Collection Time: 05/10/18 11:42 AM  Result Value Ref Range   Glucose-Capillary 118 (H) 70 - 99 mg/dL  Glucose, capillary     Status: Abnormal   Collection Time: 05/10/18  4:54 PM  Result Value Ref Range   Glucose-Capillary 172 (H) 70 - 99 mg/dL  Glucose, capillary     Status: Abnormal   Collection Time: 05/10/18  8:31 PM  Result Value Ref Range   Glucose-Capillary 154 (H) 70 - 99 mg/dL  CBC     Status: Abnormal   Collection Time: 05/11/18  5:57 AM  Result Value Ref Range   WBC 15.3 (H) 4.0 - 10.5 K/uL   RBC 4.85 4.22 - 5.81 MIL/uL   Hemoglobin 14.7 13.0 - 17.0 g/dL   HCT 43.3 39.0 - 52.0 %   MCV 89.3 78.0 - 100.0 fL   MCH 30.3 26.0 - 34.0 pg   MCHC 33.9 30.0 - 36.0 g/dL   RDW 13.4 11.5 - 15.5 %   Platelets 566 (H) 150 - 400 K/uL    Comment: Performed at [facility], [place] [address], [place], [place] [address]  Glucose, capillary     Status: Abnormal   Collection Time: 05/11/18  7:45 AM  Result Value Ref Range   Glucose-Capillary 125 (H) 70 - 99 mg/dL   Mr Pelvis W Wo Contrast  Result Date: 05/10/2018 CLINICAL DATA:  Left groin infection EXAM: MRI PELVIS WITHOUT AND WITH CONTRAST TECHNIQUE: Multiplanar multisequence MR imaging of the pelvis was performed both before and after administration of intravenous contrast. CONTRAST:  9 mL Gadavist COMPARISON:  None. FINDINGS: Urinary Tract: The bladder is unremarkable for the degree of distention. No intraluminal mass. Hydroureter. Bowel:  Unremarkable visualized pelvic bowel loops. Vascular/Lymphatic: Enlarged left inguinal nodes measuring up to 13 mm short  axis likely reactive. Reproductive: Small fat containing inguinal hernia. Prostate and seminal vesicles are unremarkable. Testicles demonstrate no acute abnormality or mass. Other:  None Musculoskeletal: Diffuse subcutaneous soft tissue edema consistent with cellulitis of the left hip and thigh is noted with smaller subcutaneous foci of abnormal enhancement along the anterior aspect of the left hip tracking inferomedial along the medial left thigh, the greatest collection measuring at least 7.8 x 2.5 x 6 cm. Mild myositis of the abductor and pectineus muscles without definite evidence of pyomyositis. No marrow signal abnormalities to suggest osteomyelitis. No fracture. IMPRESSION: 1. Moderate degree of inflammatory soft tissue thickening and edema of the left hip and thigh with tracking fluid along the anteromedial aspect of the thigh, the greatest collection measuring 7.8 x 2.5 x 6 cm medially consistent with soft tissue abscesses. 2. Myositis of the adductor and pectineus muscles on the left. Electronically Signed   By: Ashley Royalty M.D.   On: 05/10/2018 18:58      Assessment/Plan Active Problems:   Cellulitis  Type II DM - per medicine  Cellulitis & abscess left upper thigh/groin - needs I&D in OR but pt ate breakfast, will take to OR tomorrow for surgery  FEN: carb mod, NPO at midnight VTE: SCD's, lovenox ID: Clindamycin 09/22-09/23, Vanc once 09/22;  Rocephin 09/23 >> Flagyl 09/25>> WBC 15.3 Foley: none Follow up: TBD  Plan: OR tomorrow for I&D, NPO at midnight   Kalman Drape, [facility] 05/11/2018, 9:18 AM Pager: [phone] Consults: [phone] Mon-Fri 7:00 am-4:30 pm Sat-Sun 7:00 am-11:30 am

## 2018-05-11 NOTE — Progress Notes (Signed)
MD notified due to acute change in patient status. Vital signs and labs are listed below.  MD notified(1st page) Time of 1st page:  1430 Responding MD:  [place] Crane DO Time MD responded: 000 MD response: Orders received to increase vital monitoring  Vital Signs Vitals:   05/10/18 1422 05/10/18 2019 05/11/18 0530 05/11/18 1413  BP:  134/89 116/85 (!) 134/91  Pulse: 100 97 85 (!) 101  Resp: 13 (!) 23 18 (!) 24  Temp:  99.4 F (37.4 C) 98.6 F (37 C) 97.6 F (36.4 C)  TempSrc:  Oral Oral Oral  SpO2:  94% 99% 98%  Weight:      Height:         Lab Results WBC  Date/Time Value Ref Range Status  05/11/2018 05:57 AM 15.3 (H) 4.0 - 10.5 K/uL Final  05/10/2018 05:51 AM 15.0 (H) 4.0 - 10.5 K/uL Final  05/09/2018 05:48 AM 17.6 (H) 4.0 - 10.5 K/uL Final   Neutrophils Relative %  Date/Time Value Ref Range Status  05/08/2018 10:08 AM 82 % Final  02/07/2018 03:30 PM 75.9 43.0 - 77.0 % Final   No results found for: PCO2ART Lactic Acid, Venous  Date/Time Value Ref Range Status  05/10/2018 05:51 AM 0.7 0.5 - 1.9 mmol/L Final    Comment:    Performed at [facility], [place] [address] [place], [place] [address]  01/27/2018 05:26 PM 1.9 0.5 - 1.9 mmol/L Final    Comment:    Performed at [facility], [place] [address], [place], [place] [address]  01/27/2018 02:55 PM 2.18 (HH) 0.5 - 1.9 mmol/L Final   No results found for: PCO2VEN  RRN notified to review pts chart. MD paged; will implement any orders placed. Will continue to monitor.  Bobby Rumpf, RN 05/11/2018, 2:32 PM

## 2018-05-11 NOTE — Progress Notes (Signed)
PT Cancellation Note  Patient Details Name: Erik Rojas MRN: [id number] DOB: 10/15/1970   Cancelled Treatment:    Reason Eval/Treat Not Completed: Other (comment) Pt pleasantly declines PT at this time and prefers to attempt after surgery.  Possible I&D later today or tomorrow.  Pt requests PT check back after surgery.   Boomer Winders,KATHrine E 05/11/2018, 10:54 AM Carmelia Bake, PT, DPT Acute Rehabilitation Services Office: [phone] Pager: [phone]

## 2018-05-12 ENCOUNTER — Inpatient Hospital Stay (HOSPITAL_COMMUNITY): Payer: 59 | Admitting: Anesthesiology

## 2018-05-12 ENCOUNTER — Encounter (HOSPITAL_COMMUNITY): Payer: Self-pay | Admitting: Emergency Medicine

## 2018-05-12 ENCOUNTER — Encounter (HOSPITAL_COMMUNITY)
Admission: EM | Disposition: A | Discharge status: Home / Self Care | Payer: Self-pay | Source: Home / Self Care | Attending: Internal Medicine

## 2018-05-12 HISTORY — PX: IRRIGATION AND DEBRIDEMENT ABSCESS: SHX5252

## 2018-05-12 LAB — BASIC METABOLIC PANEL
Anion gap: 8 (ref 5–15)
BUN: 18 mg/dL (ref 6–20)
CHLORIDE: 105 mmol/L (ref 98–111)
CO2: 26 mmol/L (ref 22–32)
Calcium: 8.6 mg/dL — ABNORMAL LOW (ref 8.9–10.3)
Creatinine, Ser: 0.95 mg/dL (ref 0.61–1.24)
GFR calc Af Amer: >60 mL/min (ref >60)
GFR calc non Af Amer: >60 mL/min (ref >60)
GLUCOSE: 137 mg/dL — AB (ref 70–99)
POTASSIUM: 4.4 mmol/L (ref 3.5–5.1)
Sodium: 139 mmol/L (ref 135–145)

## 2018-05-12 LAB — CBC
HCT: 44 % (ref 39.0–52.0)
Hemoglobin: 15 g/dL (ref 13.0–17.0)
MCH: 30.4 pg (ref 26.0–34.0)
MCHC: 34.1 g/dL (ref 30.0–36.0)
MCV: 89.2 fL (ref 78.0–100.0)
Platelets: 586 10*3/uL — ABNORMAL HIGH (ref 150–400)
RBC: 4.93 MIL/uL (ref 4.22–5.81)
RDW: 13.3 % (ref 11.5–15.5)
WBC: 14.4 10*3/uL — ABNORMAL HIGH (ref 4.0–10.5)

## 2018-05-12 LAB — GLUCOSE, CAPILLARY
GLUCOSE-CAPILLARY: 145 mg/dL — AB (ref 70–99)
GLUCOSE-CAPILLARY: 215 mg/dL — AB (ref 70–99)
Glucose-Capillary: 134 mg/dL — ABNORMAL HIGH (ref 70–99)
Glucose-Capillary: 131 mg/dL — ABNORMAL HIGH (ref 70–99)
Glucose-Capillary: 214 mg/dL — ABNORMAL HIGH (ref 70–99)

## 2018-05-12 LAB — MAGNESIUM: Magnesium: 1.8 mg/dL (ref 1.7–2.4)

## 2018-05-12 LAB — PHOSPHORUS: Phosphorus: 3.7 mg/dL (ref 2.5–4.6)

## 2018-05-12 SURGERY — IRRIGATION AND DEBRIDEMENT ABSCESS
Anesthesia: General | Site: Thigh | Laterality: Left

## 2018-05-12 MED ORDER — PROPOFOL 10 MG/ML IV BOLUS
INTRAVENOUS | Status: AC
  Filled 2018-05-12: qty 20

## 2018-05-12 MED ORDER — FENTANYL CITRATE (PF) 100 MCG/2ML IJ SOLN
INTRAMUSCULAR | Status: DC | PRN
  Administered 2018-05-12: 100 ug via INTRAVENOUS
  Administered 2018-05-12: 50 ug via INTRAVENOUS

## 2018-05-12 MED ORDER — HYDROMORPHONE HCL 1 MG/ML IJ SOLN
0.5000 mg | INTRAMUSCULAR | Status: DC | PRN
  Administered 2018-05-13: 1 mg via INTRAVENOUS
  Filled 2018-05-12: qty 1

## 2018-05-12 MED ORDER — ONDANSETRON HCL 4 MG/2ML IJ SOLN
INTRAMUSCULAR | Status: DC | PRN
  Administered 2018-05-12: 4 mg via INTRAVENOUS

## 2018-05-12 MED ORDER — LIDOCAINE 2% (20 MG/ML) 5 ML SYRINGE
INTRAMUSCULAR | Status: DC | PRN
  Administered 2018-05-12: 100 mg via INTRAVENOUS

## 2018-05-12 MED ORDER — DEXAMETHASONE SODIUM PHOSPHATE 10 MG/ML IJ SOLN
INTRAMUSCULAR | Status: DC | PRN
  Administered 2018-05-12: 8 mg via INTRAVENOUS

## 2018-05-12 MED ORDER — ONDANSETRON HCL 4 MG/2ML IJ SOLN
INTRAMUSCULAR | Status: AC
  Filled 2018-05-12: qty 2

## 2018-05-12 MED ORDER — LACTATED RINGERS IV SOLN
INTRAVENOUS | Status: DC
  Administered 2018-05-12: 08:00:00 via INTRAVENOUS

## 2018-05-12 MED ORDER — PHENYLEPHRINE 40 MCG/ML (10ML) SYRINGE FOR IV PUSH (FOR BLOOD PRESSURE SUPPORT)
PREFILLED_SYRINGE | INTRAVENOUS | Status: AC
  Filled 2018-05-12: qty 10

## 2018-05-12 MED ORDER — PROMETHAZINE HCL 25 MG/ML IJ SOLN: 6.2500 mg | INTRAMUSCULAR | Status: DC | PRN

## 2018-05-12 MED ORDER — HYDROMORPHONE HCL 1 MG/ML IJ SOLN: 0.2500 mg | INTRAMUSCULAR | Status: DC | PRN

## 2018-05-12 MED ORDER — PROPOFOL 10 MG/ML IV BOLUS
INTRAVENOUS | Status: DC | PRN
  Administered 2018-05-12: 200 mg via INTRAVENOUS

## 2018-05-12 MED ORDER — DEXAMETHASONE SODIUM PHOSPHATE 10 MG/ML IJ SOLN
INTRAMUSCULAR | Status: AC
  Filled 2018-05-12: qty 1

## 2018-05-12 MED ORDER — SODIUM CHLORIDE 0.9 % IR SOLN
Status: DC | PRN
  Administered 2018-05-12: 3000 mL

## 2018-05-12 MED ORDER — MIDAZOLAM HCL 5 MG/5ML IJ SOLN
INTRAMUSCULAR | Status: DC | PRN
  Administered 2018-05-12: 2 mg via INTRAVENOUS

## 2018-05-12 MED ORDER — FENTANYL CITRATE (PF) 100 MCG/2ML IJ SOLN
INTRAMUSCULAR | Status: AC
  Filled 2018-05-12: qty 2

## 2018-05-12 MED ORDER — OXYCODONE HCL 5 MG PO TABS: 5.0000 mg | ORAL_TABLET | ORAL | Status: DC | PRN

## 2018-05-12 MED ORDER — PHENYLEPHRINE 40 MCG/ML (10ML) SYRINGE FOR IV PUSH (FOR BLOOD PRESSURE SUPPORT)
PREFILLED_SYRINGE | INTRAVENOUS | Status: DC | PRN
  Administered 2018-05-12 (×7): 80 ug via INTRAVENOUS

## 2018-05-12 MED ORDER — MIDAZOLAM HCL 2 MG/2ML IJ SOLN
INTRAMUSCULAR | Status: AC
  Filled 2018-05-12: qty 2

## 2018-05-12 MED ORDER — BUPIVACAINE-EPINEPHRINE (PF) 0.25% -1:200000 IJ SOLN
INTRAMUSCULAR | Status: AC
  Filled 2018-05-12: qty 30

## 2018-05-12 MED ORDER — LIDOCAINE 2% (20 MG/ML) 5 ML SYRINGE
INTRAMUSCULAR | Status: AC
  Filled 2018-05-12: qty 5

## 2018-05-12 SURGICAL SUPPLY — 30 items
BLADE SURG SZ10 CARB STEEL (BLADE) ×1 IMPLANT
DECANTER SPIKE VIAL GLASS SM (MISCELLANEOUS) ×1 IMPLANT
DERMABOND ADVANCED (GAUZE/BANDAGES/DRESSINGS)
DERMABOND ADVANCED .7 DNX12 (GAUZE/BANDAGES/DRESSINGS) IMPLANT
DRAIN PENROSE 18X1/2 LTX STRL (DRAIN) ×1 IMPLANT
DRAPE LAPAROTOMY TRNSV 102X78 (DRAPE) IMPLANT
DRAPE LG THREE QUARTER DISP (DRAPES) ×1 IMPLANT
DRAPE SHEET LG 3/4 BI-LAMINATE (DRAPES) ×1 IMPLANT
ELECT PENCIL ROCKER SW 15FT (MISCELLANEOUS) ×1 IMPLANT
ELECT REM PT RETURN 15FT ADLT (MISCELLANEOUS) ×2 IMPLANT
GAUZE SPONGE 4X4 12PLY STRL (GAUZE/BANDAGES/DRESSINGS) ×2 IMPLANT
GLOVE BIOGEL PI IND STRL 7.0 (GLOVE) ×1 IMPLANT
GLOVE BIOGEL PI INDICATOR 7.0 (GLOVE) ×1
GOWN STRL REUS W/TWL 2XL LVL3 (GOWN DISPOSABLE) ×1 IMPLANT
GOWN STRL REUS W/TWL XL LVL3 (GOWN DISPOSABLE) ×4 IMPLANT
HANDPIECE INTERPULSE COAX TIP (DISPOSABLE) ×1
KIT BASIN OR (CUSTOM PROCEDURE TRAY) ×2 IMPLANT
NEEDLE HYPO 22GX1.5 SAFETY (NEEDLE) ×1 IMPLANT
PACK BASIC VI WITH GOWN DISP (CUSTOM PROCEDURE TRAY) ×1 IMPLANT
PACK GENERAL/GYN (CUSTOM PROCEDURE TRAY) ×1 IMPLANT
SET HNDPC FAN SPRY TIP SCT (DISPOSABLE) IMPLANT
SPONGE LAP 18X18 RF (DISPOSABLE) ×1 IMPLANT
STAPLER VISISTAT 35W (STAPLE) IMPLANT
SUT ETHILON 2 0 PS N (SUTURE) ×1 IMPLANT
SUT VIC AB 3-0 SH 18 (SUTURE) IMPLANT
SUT VIC AB 4-0 PS2 18 (SUTURE) IMPLANT
SYR 20CC LL (SYRINGE) ×1 IMPLANT
TOWEL OR 17X26 10 PK STRL BLUE (TOWEL DISPOSABLE) ×2 IMPLANT
TOWEL OR NON WOVEN STRL DISP B (DISPOSABLE) ×2 IMPLANT
YANKAUER SUCT BULB TIP 10FT TU (MISCELLANEOUS) ×1 IMPLANT

## 2018-05-12 NOTE — Progress Notes (Signed)
PT Cancellation Note  Patient Details Name: Erik Rojas MRN: [id number] DOB: [birth year]   Cancelled Treatment:    Reason Eval/Treat Not Completed: Patient at procedure or test/unavailable   Lisaanne Lawrie,KATHrine E 05/12/2018, 8:25 AM Carmelia Bake, PT, DPT Acute Rehabilitation Services Office: [phone] Pager: [phone]

## 2018-05-12 NOTE — Anesthesia Procedure Notes (Signed)
Procedure Name: LMA Insertion Date/Time: 05/12/2018 9:29 AM Performed by: Maxwell Caul, CRNA Pre-anesthesia Checklist: Patient identified, Emergency Drugs available, Suction available and Patient being monitored Patient Re-evaluated:Patient Re-evaluated prior to induction Oxygen Delivery Method: Circle system utilized Preoxygenation: Pre-oxygenation with 100% oxygen Induction Type: IV induction LMA: LMA inserted LMA Size: 5.0 Number of attempts: 1 Placement Confirmation: positive ETCO2 and breath sounds checked- equal and bilateral Tube secured with: Tape Dental Injury: Teeth and Oropharynx as per pre-operative assessment

## 2018-05-12 NOTE — Progress Notes (Signed)
PROGRESS NOTE  Erik Rojas IWL:[id number] DOB: 06-Dec-1970 DOA: 05/08/2018 PCP: Billie Ruddy, MD  HPI/Recap of past 24 hours:  Erik Rojas is a 47 y.o. male with medical history significant of hypertension, diabetes mellitus, who was recently hospitalized 3 months ago for testicular abscess which is now resolved, presents to the emergency room with chief complaint of left inguinal area and left thigh redness onset 5 days ago.  His urologist which was seen for his prior scrotal abscess diagnosed with cellulitis and placed on Bactrim.  Despite being on Bactrim he noticed that he is inguinal area and thigh redness has progressively gotten larger and larger.  He did have a temp of 100 few days ago.    Admitted for left thigh cellulitis. -05/09/2018: Redness on his thigh still present and swelling still uncomfortable.  Worse when he walks. -05/10/2018: Still significant redness and edema from his left groin cellulitis.  Negative CT.  MRI revealed abscesses and myositis in the left groin. -05/11/2018: Reports pain in his left groin when palpated/examined. Leukocytosis is persistent.  General surgery consulted.  Possible I&D in the OR tomorrow 05/12/2018.  N.p.o. after midnight.  05/12/2018: Patient seen and examined at his bedside.  POD #0 post I&D of left groin/upper thigh.  No pain at this time.  Large complex abscess with some necrotic fatty tissue as reported by general surgery.  Sample sent for culture.   Assessment/Plan: Active Problems:   Cellulitis  Sepsis secondary to severe left thigh complex abscesses and some necrotic fatty tissue  POD #0 post I&D by Dr. Barry Dienes MRSA negative Blood cultures x2- to date Continue IV antibiotics until discontinued by general surgery Monitor fever curve Monitor WBC Obtain CBC in the morning Pain management in place with IV Dilaudid for severe pain and OxyIR for moderate pain Start bowel regimen with Senokot twice daily and  MiraLAX daily to avoid opiates induced constipation  Type 2 diabetes, tightly controlled A1c 11.0 on 01/27/2018 A1c done on 05/09/2018 was 5.2 Avoid hypoglycemia At home was on 25 units Lantus nightly, NovoLog and metformin During this hospitalization reduce Lantus to 15 units nightly Diabetes coordinator consulted for diabetes education to avoid hypoglycemia  Obesity BMI 35 Recommend continue weight loss outpatient with regular physical activity and healthy dieting  Hypertension Blood pressures well controlled Continue amlodipine 5 mg daily  Risks: High risk for decompensation if not treated with IV antibiotics due to the large complex abscesses and necrotic tissue post I&D POD #0.  Body fluid sent for culture.  Code Status: Full code  Family Communication: None at bedside  Disposition Plan: Home possibly tomorrow 05/12/2018 or when general surgery signs off.   Consultants:  General surgery  Procedures:  None  Antimicrobials:  IV ceftriaxone  IV Flagyl  DVT prophylaxis: Subcu Lovenox   Objective: Vitals:   05/12/18 1100 05/12/18 1115 05/12/18 1130 05/12/18 1404  BP: 125/79 129/83 124/80 (!) 133/94  Pulse: 94 91 84 99  Resp: 18 12 18  (!) 24  Temp:   98 F (36.7 C) 98.8 F (37.1 C)  TempSrc:      SpO2: 99% 100% 98% 97%  Weight:      Height:        Intake/Output Summary (Last 24 hours) at 05/12/2018 1841 Last data filed at 05/12/2018 1829 Gross per 24 hour  Intake 1550 ml  Output -  Net 1550 ml   Filed Weights   05/08/18 0904 05/12/18 0818  Weight: 98 kg 98 kg  Exam:  . General: 47 y.o. year-old male well-developed well-nourished no acute distress.  Alert and oriented x3.   . Cardiovascular: Regular rate and rhythm with no rubs or gallops.  No JVD or thyromegaly noted.  Marland Kitchen Respiratory: Lungs are clear to auscultation with no wheezes or rales.  Good inspiratory effort. . Abdomen: Soft nontender nondistended with normal bowel sounds x4  quadrants. . Musculoskeletal: 2/4 pulses in all 4 extremities. . Skin: Left upper thigh and surgical wrap.  No surrounding edema. Marland Kitchen Psychiatry: Mood is appropriate for condition and setting   Data Reviewed: CBC: Recent Labs  Lab 05/08/18 1008 05/09/18 0548 05/10/18 0551 05/11/18 0557 05/12/18 0548  WBC 20.2* 17.6* 15.0* 15.3* 14.4*  NEUTROABS 16.8*  --   --   --   --   HGB 16.2 13.7 13.9 14.7 15.0  HCT 46.1 39.3 41.1 43.3 44.0  MCV 87.1 89.1 89.0 89.3 89.2  PLT 483* 464* 515* 566* 102*   Basic Metabolic Panel: Recent Labs  Lab 05/08/18 1008 05/09/18 0548 05/10/18 0551 05/12/18 0548  NA 138 138 141 139  K 3.9 4.0 4.3 4.4  CL 101 103 104 105  CO2 22 27 28 26   GLUCOSE 117* 87 92 137*  BUN 17 13 13 18   CREATININE 1.06 1.00 0.92 0.95  CALCIUM 8.8* 8.1* 8.5* 8.6*  MG  --   --   --  1.8  PHOS  --   --   --  3.7   GFR: Estimated Creatinine Clearance: 106.8 mL/min (by C-G formula based on SCr of 0.95 mg/dL). Liver Function Tests: Recent Labs  Lab 05/08/18 1008  AST 18  ALT 15  ALKPHOS 96  BILITOT 0.9  PROT 7.5  ALBUMIN 3.2*   No results for input(s): LIPASE, AMYLASE in the last 168 hours. No results for input(s): AMMONIA in the last 168 hours. Coagulation Profile: No results for input(s): INR, PROTIME in the last 168 hours. Cardiac Enzymes: No results for input(s): CKTOTAL, CKMB, CKMBINDEX, TROPONINI in the last 168 hours. BNP (last 3 results) No results for input(s): PROBNP in the last 8760 hours. HbA1C: No results for input(s): HGBA1C in the last 72 hours. CBG: Recent Labs  Lab 05/11/18 2001 05/12/18 0731 05/12/18 1112 05/12/18 1152 05/12/18 1547  GLUCAP 180* 134* 131* 145* 215*   Lipid Profile: No results for input(s): CHOL, HDL, LDLCALC, TRIG, CHOLHDL, LDLDIRECT in the last 72 hours. Thyroid Function Tests: No results for input(s): TSH, T4TOTAL, FREET4, T3FREE, THYROIDAB in the last 72 hours. Anemia Panel: No results for input(s): VITAMINB12,  FOLATE, FERRITIN, TIBC, IRON, RETICCTPCT in the last 72 hours. Urine analysis:    Component Value Date/Time   COLORURINE YELLOW 01/28/2018 1900   APPEARANCEUR CLEAR 01/28/2018 1900   LABSPEC 1.036 (H) 01/28/2018 1900   PHURINE 5.0 01/28/2018 1900   GLUCOSEU >=500 (A) 01/28/2018 1900   HGBUR NEGATIVE 01/28/2018 1900   BILIRUBINUR NEGATIVE 01/28/2018 1900   KETONESUR 5 (A) 01/28/2018 1900   PROTEINUR NEGATIVE 01/28/2018 1900   NITRITE NEGATIVE 01/28/2018 1900   LEUKOCYTESUR NEGATIVE 01/28/2018 1900   Sepsis Labs: @LABRCNTIP (procalcitonin:4,lacticidven:4)  ) Recent Results (from the past 240 hour(s))  Blood culture (routine x 2)     Status: None (Preliminary result)   Collection Time: 05/08/18  9:50 AM  Result Value Ref Range Status   Specimen Description   Final    BLOOD LEFT ANTECUBITAL Performed at [facility], [place] [address] [place],  [address]    Special Requests  Final    BOTTLES DRAWN AEROBIC AND ANAEROBIC Blood Culture adequate volume Performed at [place] [address], [place], [place] [address]    Culture   Final    NO GROWTH 4 DAYS Performed at [facility], [place] [address], [place], [place] [address]    Report Status PENDING  Incomplete  Blood culture (routine x 2)     Status: None (Preliminary result)   Collection Time: 05/08/18  9:58 AM  Result Value Ref Range Status   Specimen Description   Final    BLOOD RIGHT ANTECUBITAL Performed at [place] [address], [place], [place] [address]    Special Requests   Final    BOTTLES DRAWN AEROBIC AND ANAEROBIC Blood Culture adequate volume Performed at [place] [address] [place] [address] [place], [place] [address]    Culture   Final    NO GROWTH 4 DAYS Performed at [facility], [place] [address] [place], [place] [address]    Report Status PENDING  Incomplete  MRSA PCR Screening     Status: None   Collection  Time: 05/09/18  8:28 AM  Result Value Ref Range Status   MRSA by PCR NEGATIVE NEGATIVE Final    Comment:        The GeneXpert MRSA Assay (FDA approved for NASAL specimens only), is one component of a comprehensive MRSA colonization surveillance program. It is not intended to diagnose MRSA infection nor to guide or monitor treatment for MRSA infections. Performed at [facility], [place] [address] [place], [place] [address]   Aerobic/Anaerobic Culture (surgical/deep wound)     Status: None (Preliminary result)   Collection Time: 05/12/18 10:10 AM  Result Value Ref Range Status   Specimen Description   Final    ABSCESS LEFT THIGH Performed at [place] [address], [place], [place] [address]    Special Requests   Final    NONE Performed at [facility], [place] [address] [place], [place] [address]    Gram Stain   Final    MODERATE WBC PRESENT, PREDOMINANTLY PMN MODERATE GRAM POSITIVE COCCI Performed at [facility], [place] [address], [place], [place] [address]    Culture PENDING  Incomplete   Report Status PENDING  Incomplete      Studies: No results found.  Scheduled Meds: . amLODipine  5 mg Oral Daily  . enoxaparin (LOVENOX) injection  40 mg Subcutaneous Q24H  . insulin aspart  0-9 Units Subcutaneous TID WC  . insulin glargine  15 Units Subcutaneous Q2200  . lisinopril  10 mg Oral Daily    Continuous Infusions: . cefTRIAXone (ROCEPHIN)  IV 2 g (05/12/18 0515)  . metronidazole 500 mg (05/12/18 1655)     LOS: 3 days     Kayleen Memos, MD [facility] Pager [phone]  If 7PM-7AM, please contact night-coverage [email or web address] Password [facility] 05/12/2018, 6:41 PM

## 2018-05-12 NOTE — Transfer of Care (Signed)
Immediate Anesthesia Transfer of Care Note  Patient: Erik Rojas  Procedure(s) Performed: IRRIGATION AND DEBRIDEMENT OF LEFT UPPER THIGH ABSCESS (Left Thigh)  Patient Location: PACU  Anesthesia Type:General  Level of Consciousness: awake, alert , oriented and patient cooperative  Airway & Oxygen Therapy: Patient Spontanous Breathing and Patient connected to face mask oxygen  Post-op Assessment: Report given to RN and Post -op Vital signs reviewed and stable  Post vital signs: Reviewed and stable  Last Vitals:  Vitals Value Taken Time  BP 123/80 05/12/2018 10:56 AM  Temp 36.8 C 05/12/2018 10:55 AM  Pulse 94 05/12/2018 10:59 AM  Resp 18 05/12/2018 10:59 AM  SpO2 99 % 05/12/2018 10:59 AM  Vitals shown include unvalidated device data.  Last Pain:  Vitals:   05/12/18 0818  TempSrc: Oral  PainSc: 0-No pain      Patients Stated Pain Goal: 4 (33/61/22 4497)  Complications: No apparent anesthesia complications

## 2018-05-12 NOTE — Anesthesia Preprocedure Evaluation (Addendum)
Anesthesia Evaluation  Patient identified by MRN, date of birth, ID band Patient awake    Reviewed: Allergy & Precautions, NPO status , Patient's Chart, lab work & pertinent test results  History of Anesthesia Complications Negative for: history of anesthetic complications  Airway Mallampati: II  TM Distance: >3 FB Neck ROM: Full    Dental no notable dental hx. (+) Dental Advisory Given   Pulmonary neg pulmonary ROS,    Pulmonary exam normal        Cardiovascular hypertension, Pt. on medications Normal cardiovascular exam     Neuro/Psych negative neurological ROS     GI/Hepatic negative GI ROS, Neg liver ROS,   Endo/Other  diabetesMorbid obesity  Renal/GU negative Renal ROS     Musculoskeletal negative musculoskeletal ROS (+)   Abdominal   Peds  Hematology negative hematology ROS (+)   Anesthesia Other Findings Day of surgery medications reviewed with the patient.  Reproductive/Obstetrics                            Anesthesia Physical Anesthesia Plan  ASA: III  Anesthesia Plan: General   Post-op Pain Management:    Induction: Intravenous  PONV Risk Score and Plan: 2 and Ondansetron and Dexamethasone  Airway Management Planned: LMA  Additional Equipment:   Intra-op Plan:   Post-operative Plan: Extubation in OR  Informed Consent: I have reviewed the patients History and Physical, chart, labs and discussed the procedure including the risks, benefits and alternatives for the proposed anesthesia with the patient or authorized representative who has indicated his/her understanding and acceptance.   Dental advisory given  Plan Discussed with: CRNA, Anesthesiologist and Surgeon  Anesthesia Plan Comments:        Anesthesia Quick Evaluation

## 2018-05-12 NOTE — Op Note (Signed)
Incision and Drainage, irrigation and debridement complex left groin/upper thigh abscess  Pre-operative Diagnosis: left groin/upper thigh abscess   Post-operative Diagnosis: same   Indications: cellulitis that coalesced into multiloculated abscess in left groin and inner thigh.    Anesthesia: General   Procedure Details  The procedure, risks and complications have been discussed in detail (including, infection, bleeding, need for additional procedures) with the patient, and the patient has signed consent to the procedure. The patient was informed that the wound would be left open.  The patient was taken to OR 4 and general anesthesia was induced. The patient was placed into lithotomy position.  The skin was sterilely prepped and draped over the affected area in the usual fashion. Time out was performed according to the surgical safety checklist.   A sounder needle was used to identify the area of purulent drainage.  I&D with a #11 blade was performed on the left upper inner thigh region. The incision was extended to encompass the true abscess.  Cultures were sent. Additional skin was debrided sharply with scissors around the opening. Purulent drainage was present. There was dead fat underneath the skin that was debrided sharply with scissors, approximately a volume of 8x4x2 cm.  The pulse lavage was used to irrigate the cavity. There was a tunnel extending laterally toward the ASIS.  A 1/2 inch penrose was placed in the tunnel to exit the skin laterally.  Both ends were secured with a 2-0 nylon.  The wound was packed with tea strength betadine soaked kerlix.  The patient was awakened from anesthesia and taken to the PACU in stable condition.   Findings:  Large complex abscess with some necrotic fatty tissue  EBL: <25 cc's   Drains: 1/2 inch penrose.     Condition: Tolerated procedure well   Complications:  none known.

## 2018-05-12 NOTE — Anesthesia Postprocedure Evaluation (Signed)
Anesthesia Post Note  Patient: Erik Rojas  Procedure(s) Performed: IRRIGATION AND DEBRIDEMENT OF LEFT UPPER THIGH ABSCESS (Left Thigh)     Patient location during evaluation: PACU Anesthesia Type: General Level of consciousness: sedated Pain management: pain level controlled Vital Signs Assessment: post-procedure vital signs reviewed and stable Respiratory status: spontaneous breathing and respiratory function stable Cardiovascular status: stable Postop Assessment: no apparent nausea or vomiting Anesthetic complications: no                 Derrick Tiegs DANIEL

## 2018-05-12 NOTE — Interval H&P Note (Signed)
History and Physical Interval Note:  05/12/2018 9:11 AM  Erik Rojas  has presented today for surgery, with the diagnosis of LEFT UPPER THIGH ABSCESS  The various methods of treatment have been discussed with the patient and family. After consideration of risks, benefits and other options for treatment, the patient has consented to  Procedure(s): IRRIGATION AND DEBRIDEMENT OF LEFT UPPER THIGH ABSCESS (Left) as a surgical intervention .  The patient's history has been reviewed, patient examined, no change in status, stable for surgery.  I have reviewed the patient's chart and labs.  Questions were answered to the patient's satisfaction.     Stark Klein

## 2018-05-13 ENCOUNTER — Encounter (HOSPITAL_COMMUNITY): Payer: Self-pay | Admitting: General Surgery

## 2018-05-13 LAB — GLUCOSE, CAPILLARY
GLUCOSE-CAPILLARY: 128 mg/dL — AB (ref 70–99)
GLUCOSE-CAPILLARY: 168 mg/dL — AB (ref 70–99)
GLUCOSE-CAPILLARY: 160 mg/dL — AB (ref 70–99)
Glucose-Capillary: 145 mg/dL — ABNORMAL HIGH (ref 70–99)

## 2018-05-13 LAB — CULTURE, BLOOD (ROUTINE X 2)
CULTURE: NO GROWTH
Culture: NO GROWTH
Special Requests: ADEQUATE
Special Requests: ADEQUATE

## 2018-05-13 LAB — CBC
HCT: 43.3 % (ref 39.0–52.0)
Hemoglobin: 14.4 g/dL (ref 13.0–17.0)
MCH: 30 pg (ref 26.0–34.0)
MCHC: 33.3 g/dL (ref 30.0–36.0)
MCV: 90.2 fL (ref 78.0–100.0)
PLATELETS: 585 10*3/uL — AB (ref 150–400)
RBC: 4.8 MIL/uL (ref 4.22–5.81)
RDW: 13.2 % (ref 11.5–15.5)
WBC: 18.6 10*3/uL — ABNORMAL HIGH (ref 4.0–10.5)

## 2018-05-13 LAB — BASIC METABOLIC PANEL
Anion gap: 9 (ref 5–15)
BUN: 20 mg/dL (ref 6–20)
CALCIUM: 8.3 mg/dL — AB (ref 8.9–10.3)
CO2: 25 mmol/L (ref 22–32)
Chloride: 105 mmol/L (ref 98–111)
Creatinine, Ser: 0.96 mg/dL (ref 0.61–1.24)
GFR calc Af Amer: >60 mL/min (ref >60)
GLUCOSE: 156 mg/dL — AB (ref 70–99)
Potassium: 4.1 mmol/L (ref 3.5–5.1)
SODIUM: 139 mmol/L (ref 135–145)

## 2018-05-13 LAB — PHOSPHORUS: PHOSPHORUS: 3.4 mg/dL (ref 2.5–4.6)

## 2018-05-13 LAB — MAGNESIUM: Magnesium: 1.9 mg/dL (ref 1.7–2.4)

## 2018-05-13 MED ORDER — CEFAZOLIN SODIUM-DEXTROSE 1-4 GM/50ML-% IV SOLN: 1.0000 g | Freq: Three times a day (TID) | INTRAVENOUS | Status: DC

## 2018-05-13 MED ORDER — INSULIN ASPART 100 UNIT/ML ~~LOC~~ SOLN: 0.0000 [IU] | Freq: Three times a day (TID) | SUBCUTANEOUS | Status: DC

## 2018-05-13 MED ORDER — HYDROMORPHONE HCL 1 MG/ML IJ SOLN: 0.5000 mg | INTRAMUSCULAR | Status: DC | PRN

## 2018-05-13 MED ORDER — INSULIN ASPART 100 UNIT/ML ~~LOC~~ SOLN: 0.0000 [IU] | Freq: Every day | SUBCUTANEOUS | Status: DC

## 2018-05-13 MED ORDER — CEFAZOLIN SODIUM-DEXTROSE 2-4 GM/100ML-% IV SOLN
2.0000 g | Freq: Three times a day (TID) | INTRAVENOUS | Status: DC
  Administered 2018-05-13 – 2018-05-14 (×3): 2 g via INTRAVENOUS
  Filled 2018-05-13 (×5): qty 100

## 2018-05-13 NOTE — Progress Notes (Signed)
Pharmacy Antibiotic Note  Erik Rojas is a 47 y.o. male admitted on 05/08/2018 with abscess and cellulitis of left upper thigh/groin.  Pharmacy has been consulted for cefazolin dosing.  Pt has a history of testicular abscess several months ago. Admitted with left thigh/inguinal area cellulitis/abscess. Pt was prescribed SMX/TMP PTA with no improvement.   Significant Events:  I&D 9/26  Today, 05/13/18  WBC 18.6 remains elevated, increased from yesterday. Of note, pt received dexamethasone 10 mg IV on 9/26  Afebrile  SCr ~1 stable and WNL, CrCl ~ 100 mLmin  Antibiotics being changed from CTX 2 g IV daily + metronidazole 500 mg IV q8h to cefazolin per MD  Plan:  Cefazolin 2 g IV q8h  Follow cultures and renal function  Height: 5\' 5"  (165.1 cm) Weight: 216 lb (98 kg) IBW/kg (Calculated) : 61.5  Temp (24hrs), Avg:98.5 F (36.9 C), Min:98 F (36.7 C), Max:98.8 F (37.1 C)  Recent Labs  Lab 05/08/18 1008 05/09/18 0548 05/10/18 0551 05/11/18 0557 05/12/18 0548 05/13/18 0601  WBC 20.2* 17.6* 15.0* 15.3* 14.4* 18.6*  CREATININE 1.06 1.00 0.92  --  0.95 0.96  LATICACIDVEN  --   --  0.7  --   --   --     Estimated Creatinine Clearance: 105.7 mL/min (by C-G formula based on SCr of 0.96 mg/dL).    No Known Allergies  Antimicrobials this admission: cefazolin 9/27 >>  ceftriaxone 9/23 >> 9/27 Metronidazole 9/25 >> 9/27  Dose adjustments this admission: 9/25 CTX 1 g IV daily --> 2 g IV daily  Microbiology results: 9/22 BCx: NGF 9/22 MRSA PCR: Negative 9/26: Abundant staph aureus, sensitivities pending  Thank you for allowing pharmacy to be a part of this patient's care.  Lenis Noon, PharmD, BCPS Clinical Pharmacist 05/13/2018 10:02 AM

## 2018-05-13 NOTE — Progress Notes (Addendum)
PROGRESS NOTE  Erik Rojas XFG:[id number] DOB: 31-Jan-1971 DOA: 05/08/2018 PCP: Billie Ruddy, MD  HPI/Recap of past 24 hours:  Erik Rojas is a 47 y.o. male with medical history significant of hypertension, diabetes mellitus, who was recently hospitalized 3 months ago for testicular abscess which is now resolved, presents to the emergency room with chief complaint of left inguinal area and left thigh redness onset 5 days ago.  His urologist which was seen for his prior scrotal abscess diagnosed with cellulitis and placed on Bactrim.  Despite being on Bactrim he noticed that he is inguinal area and thigh redness has progressively gotten larger and larger.  He did have a temp of 100 few days ago.    Admitted for left thigh cellulitis. -05/09/2018: Redness on his thigh still present and swelling still uncomfortable.  Worse when he walks. -05/10/2018: Still significant redness and edema from his left groin cellulitis.  Negative CT.  MRI revealed abscesses and myositis in the left groin. -05/11/2018: Reports pain in his left groin when palpated/examined. Leukocytosis is persistent.  General surgery consulted.  Possible I&D in the OR tomorrow 05/12/2018.  N.p.o. after midnight. -05/12/2018: POD #0 post I&D of left groin/upper thigh.  No complications.  Sample sent for culture.  05/13/2018: Patient seen and examined at his bedside.  No acute events overnight.  Culture grew abundant staph aureus.  Awaiting sensitivities.  IV antibiotics changed from IV ceftriaxone and IV Flagyl to IV cefazolin empirically.  Patient has minimal pain from the area of incision and drainage.  General surgery following.  Greatly appreciated.   Assessment/Plan: Active Problems:   Cellulitis  Sepsis secondary to severe left thigh complex abscesses and some necrotic fatty tissue  POD #1 post I&D by Dr. Barry Dienes MRSA negative Blood cultures x2- to date With abscess culture grew abundant staph aureus.   Awaiting sensitivities. Stop IV Rocephin and IV Flagyl Start IV cefazolin empirically Continue pain management and bowel regimen Home health RN for wound care ordered Work note excuse provided  Hypertension On amlodipine 5 mg and lisinopril 10 mg daily Continue to hold lisinopril-diabetes is much improved but with no proteinuria Amlodipine dose increased to 10 mg daily Blood pressure is better controlled after increasing amlodipine Continue to monitor vital signs  Type 2 diabetes, tightly controlled A1c 11.0 on 01/27/2018 A1c done on 05/09/2018 was 5.2 Avoid hypoglycemia At home was on 25 units Lantus nightly, NovoLog and metformin During this hospitalization reduce Lantus to 15 units nightly Diabetes coordinator consulted for diabetes education to avoid hypoglycemia Continue insulin sliding scale  Obesity BMI 35 Recommend continue weight loss outpatient with regular physical activity and healthy dieting   Risks: High risk for decompensation if not treated with IV antibiotics due to the large complex abscesses and necrotic tissue post I&D POD #1.  Body fluid dependent staph aureus.  Awaiting sensitivities.  Code Status: Full code  Family Communication: None at bedside  Disposition Plan: Home possibly tomorrow 05/14/2018 when sensitivities are back or when general surgery signs off.   Consultants:  General surgery  Procedures:  None  Antimicrobials:  Cefazolin  DVT prophylaxis: Subcu Lovenox   Objective: Vitals:   05/12/18 1130 05/12/18 1404 05/12/18 2057 05/13/18 0455  BP: 124/80 (!) 133/94 (!) 131/95 126/88  Pulse: 84 99 (!) 106 87  Resp: 18 (!) 24 20 16   Temp: 98 F (36.7 C) 98.8 F (37.1 C) 98.8 F (37.1 C) 98.5 F (36.9 C)  TempSrc:   Oral   SpO2: 98%  97% 97% 99%  Weight:      Height:        Intake/Output Summary (Last 24 hours) at 05/13/2018 1235 Last data filed at 05/13/2018 0950 Gross per 24 hour  Intake 840 ml  Output 650 ml  Net 190 ml    Filed Weights   05/08/18 0904 05/12/18 0818  Weight: 98 kg 98 kg    Exam:  . General: 47 y.o. year-old male well-developed well-nourished no acute distress.  Alert and oriented x3. . Cardiovascular: Regular rate and rhythm with no rubs or gallops.  No JVD or thyromegaly noted. Marland Kitchen Respiratory: Lungs are clear to auscultation with no wheezes or rales.  Good inspiratory effort. . Abdomen: Soft nontender nondistended with normal bowel sounds x4 quadrants. . Musculoskeletal: 2/4 pulses in all 4 extremities. . Skin: Left upper thigh and surgical wrap.  No surrounding edema. Marland Kitchen Psychiatry: Mood is appropriate for condition and setting   Data Reviewed: CBC: Recent Labs  Lab 05/08/18 1008 05/09/18 0548 05/10/18 0551 05/11/18 0557 05/12/18 0548 05/13/18 0601  WBC 20.2* 17.6* 15.0* 15.3* 14.4* 18.6*  NEUTROABS 16.8*  --   --   --   --   --   HGB 16.2 13.7 13.9 14.7 15.0 14.4  HCT 46.1 39.3 41.1 43.3 44.0 43.3  MCV 87.1 89.1 89.0 89.3 89.2 90.2  PLT 483* 464* 515* 566* 586* 903*   Basic Metabolic Panel: Recent Labs  Lab 05/08/18 1008 05/09/18 0548 05/10/18 0551 05/12/18 0548 05/13/18 0601  NA 138 138 141 139 139  K 3.9 4.0 4.3 4.4 4.1  CL 101 103 104 105 105  CO2 22 27 28 26 25   GLUCOSE 117* 87 92 137* 156*  BUN 17 13 13 18 20   CREATININE 1.06 1.00 0.92 0.95 0.96  CALCIUM 8.8* 8.1* 8.5* 8.6* 8.3*  MG  --   --   --  1.8 1.9  PHOS  --   --   --  3.7 3.4   GFR: Estimated Creatinine Clearance: 105.7 mL/min (by C-G formula based on SCr of 0.96 mg/dL). Liver Function Tests: Recent Labs  Lab 05/08/18 1008  AST 18  ALT 15  ALKPHOS 96  BILITOT 0.9  PROT 7.5  ALBUMIN 3.2*   No results for input(s): LIPASE, AMYLASE in the last 168 hours. No results for input(s): AMMONIA in the last 168 hours. Coagulation Profile: No results for input(s): INR, PROTIME in the last 168 hours. Cardiac Enzymes: No results for input(s): CKTOTAL, CKMB, CKMBINDEX, TROPONINI in the last 168  hours. BNP (last 3 results) No results for input(s): PROBNP in the last 8760 hours. HbA1C: No results for input(s): HGBA1C in the last 72 hours. CBG: Recent Labs  Lab 05/12/18 1152 05/12/18 1547 05/12/18 2054 05/13/18 0757 05/13/18 1217  GLUCAP 145* 215* 214* 128* 168*   Lipid Profile: No results for input(s): CHOL, HDL, LDLCALC, TRIG, CHOLHDL, LDLDIRECT in the last 72 hours. Thyroid Function Tests: No results for input(s): TSH, T4TOTAL, FREET4, T3FREE, THYROIDAB in the last 72 hours. Anemia Panel: No results for input(s): VITAMINB12, FOLATE, FERRITIN, TIBC, IRON, RETICCTPCT in the last 72 hours. Urine analysis:    Component Value Date/Time   COLORURINE YELLOW 01/28/2018 1900   APPEARANCEUR CLEAR 01/28/2018 1900   LABSPEC 1.036 (H) 01/28/2018 1900   PHURINE 5.0 01/28/2018 1900   GLUCOSEU >=500 (A) 01/28/2018 1900   HGBUR NEGATIVE 01/28/2018 1900   BILIRUBINUR NEGATIVE 01/28/2018 1900   KETONESUR 5 (A) 01/28/2018 1900   PROTEINUR NEGATIVE 01/28/2018 1900  NITRITE NEGATIVE 01/28/2018 1900   LEUKOCYTESUR NEGATIVE 01/28/2018 1900   Sepsis Labs: @LABRCNTIP (procalcitonin:4,lacticidven:4)  ) Recent Results (from the past 240 hour(s))  Blood culture (routine x 2)     Status: None   Collection Time: 05/08/18  9:50 AM  Result Value Ref Range Status   Specimen Description   Final    BLOOD LEFT ANTECUBITAL Performed at [facility] [address], [place], [place] [address]    Special Requests   Final    BOTTLES DRAWN AEROBIC AND ANAEROBIC Blood Culture adequate volume Performed at [facility] [address], [place], [place] [address]    Culture   Final    NO GROWTH 5 DAYS Performed at [facility], [place] [address], [place], [place] [address]    Report Status 05/13/2018 FINAL  Final  Blood culture (routine x 2)     Status: None   Collection Time: 05/08/18  9:58 AM  Result Value Ref Range Status   Specimen Description    Final    BLOOD RIGHT ANTECUBITAL Performed at [facility] [address] [place], [place] [address]    Special Requests   Final    BOTTLES DRAWN AEROBIC AND ANAEROBIC Blood Culture adequate volume Performed at [facility] [address], [place], [place] [address]    Culture   Final    NO GROWTH 5 DAYS Performed at [facility], [place] [address], [place], [place] [address]    Report Status 05/13/2018 FINAL  Final  MRSA PCR Screening     Status: None   Collection Time: 05/09/18  8:28 AM  Result Value Ref Range Status   MRSA by PCR NEGATIVE NEGATIVE Final    Comment:        The GeneXpert MRSA Assay (FDA approved for NASAL specimens only), is one component of a comprehensive MRSA colonization surveillance program. It is not intended to diagnose MRSA infection nor to guide or monitor treatment for MRSA infections. Performed at [facility], [place] [address], [place], [place] [address]   Aerobic/Anaerobic Culture (surgical/deep wound)     Status: None (Preliminary result)   Collection Time: 05/12/18 10:10 AM  Result Value Ref Range Status   Specimen Description   Final    ABSCESS LEFT THIGH Performed at [facility] [address], [place], [place] [address]    Special Requests   Final    NONE Performed at [facility], [place] [address] [place], [place] [address]    Gram Stain   Final    MODERATE WBC PRESENT, PREDOMINANTLY PMN MODERATE GRAM POSITIVE COCCI Performed at [facility], [place] [address] [place], [place] [address]    Culture ABUNDANT STAPHYLOCOCCUS AUREUS  Final   Report Status PENDING  Incomplete      Studies: No results found.  Scheduled Meds: . amLODipine  5 mg Oral Daily  . enoxaparin (LOVENOX) injection  40 mg Subcutaneous Q24H  . insulin aspart  0-5 Units Subcutaneous QHS  . insulin aspart  0-9 Units Subcutaneous TID WC  . insulin glargine   15 Units Subcutaneous Q2200  . lisinopril  10 mg Oral Daily    Continuous Infusions: .  ceFAZolin (ANCEF) IV       LOS: 4 days     Kayleen Memos, MD [facility] Pager [phone]  If 7PM-7AM, please contact night-coverage [email or web address] Password [facility] 05/13/2018, 12:35 PM

## 2018-05-13 NOTE — Evaluation (Signed)
Physical Therapy Evaluation Patient Details Name: Erik Rojas MRN: [id number] DOB: May 28, 1971 Today's Date: 05/13/2018   History of Present Illness  Pt is a 47 YO male s/p I&D of L upper thigh abscess on 9/26. Pt admitted for L upper thigh/groin cellulitis on 9/22. Previous hospitalization June 2019 for scrotal abscess. PMH includes DMII.   Clinical Impression   Pt presents with no complaints of pain and proficiency in all mobility needs. Pt with no acute PT needs at this time, and has no equipment needs. Pt ambulated 250 ft with decreased gait speed due to wide BOS to accommodate abscess. PT expects return to normalized gait pattern with healing of area. PT signing off, order PT for any changes in pt condition.     Follow Up Recommendations No PT follow up    Equipment Recommendations  None recommended by PT    Recommendations for Other Services       Precautions / Restrictions Precautions Precautions: Fall Restrictions Weight Bearing Restrictions: No      Mobility  Bed Mobility Overal bed mobility: Independent             General bed mobility comments: went from supine to sit with no increased time/effort.   Transfers Overall transfer level: Needs assistance Equipment used: Rolling walker (2 wheeled) Transfers: Sit to/from Stand Sit to Stand: Modified independent (Device/Increase time)         General transfer comment: Supervision for safety.   Ambulation/Gait Ambulation/Gait assistance: Min guard;Modified independent (Device/Increase time) Gait Distance (Feet): 250 Feet Assistive device: Rolling walker (2 wheeled);None Gait Pattern/deviations: Step-through pattern;Decreased stride length;Wide base of support Gait velocity: decr    General Gait Details: Started ambulation with RW for steadying as needed, pt did not need RW so went with none. Pt with wide BOS to accomodate cellulitis area.   Stairs Stairs: Yes Stairs assistance: Modified  independent (Device/Increase time) Stair Management: One rail Left Number of Stairs: 9 General stair comments: increased time time to perform   Wheelchair Mobility    Modified Rankin (Stroke Patients Only)       Balance Overall balance assessment: Modified Independent                                           Pertinent Vitals/Pain Pain Assessment: No/denies pain    Home Living Family/patient expects to be discharged to:: Private residence Living Arrangements: Alone Available Help at Discharge: Friend(s);Available PRN/intermittently Type of Home: House Home Access: Stairs to enter Entrance Stairs-Rails: Left Entrance Stairs-Number of Steps: 3 Home Layout: One level Home Equipment: Cane - single point      Prior Function Level of Independence: Independent         Comments: used cane for a week after last surgery      Hand Dominance   Dominant Hand: Right    Extremity/Trunk Assessment   Upper Extremity Assessment Upper Extremity Assessment: Overall WFL for tasks assessed    Lower Extremity Assessment Lower Extremity Assessment: Overall WFL for tasks assessed(light MMT screen performed; WNL )    Cervical / Trunk Assessment Cervical / Trunk Assessment: Normal  Communication   Communication: No difficulties  Cognition Arousal/Alertness: Awake/alert Behavior During Therapy: WFL for tasks assessed/performed Overall Cognitive Status: Within Functional Limits for tasks assessed  General Comments      Exercises     Assessment/Plan    PT Assessment Patent does not need any further PT services  PT Problem List         PT Treatment Interventions      PT Goals (Current goals can be found in the Care Plan section)  Acute Rehab PT Goals Patient Stated Goal: none stated  PT Goal Formulation: With patient Time For Goal Achievement: 05/13/18 Potential to Achieve Goals: Good     Frequency     Barriers to discharge        Co-evaluation               AM-PAC PT "6 Clicks" Daily Activity  Outcome Measure Difficulty turning over in bed (including adjusting bedclothes, sheets and blankets)?: None Difficulty moving from lying on back to sitting on the side of the bed? : None Difficulty sitting down on and standing up from a chair with arms (e.g., wheelchair, bedside commode, etc,.)?: None Help needed moving to and from a bed to chair (including a wheelchair)?: None Help needed walking in hospital room?: None Help needed climbing 3-5 steps with a railing? : None 6 Click Score: 24    End of Session Equipment Utilized During Treatment: Gait belt Activity Tolerance: Patient tolerated treatment well Patient left: in bed;with call bell/phone within reach Nurse Communication: Mobility status PT Visit Diagnosis: Difficulty in walking, not elsewhere classified (R26.2)    Time: 1740-8144 PT Time Calculation (min) (ACUTE ONLY): 20 min   Charges:   PT Evaluation $PT Eval Low Complexity: 1 Low          Kennedey Digilio Conception Chancy, PT Acute Rehabilitation Services Pager [phone]  Office [phone]  Jamilex Bohnsack D Shelsey Rieth 05/13/2018, 2:22 PM

## 2018-05-13 NOTE — Progress Notes (Signed)
[facility]/Trauma Progress Note  1 Day Post-Op   Assessment/Plan Type II DM - per medicine  Cellulitis & abscess left upper thigh/groin - S/P I&D, irrigation and debridement L upper thigh wound, Dr. Barry Dienes, 09/26 - qshift wet to dry dressing changes - culture staph aureus   FEN: carb mod VTE: SCD's, lovenox ID: Clindamycin 09/22-09/23, Vanc once 09/22;  Rocephin 09/23 >> Flagyl 09/25>> WBC 18.6 Foley: none Follow up: Dr. Barry Dienes 2 weeks  Plan: keep penrose in place, wet to dry packing to wound, follow culture   LOS: 4 days    Subjective: CC: L thigh wound  No real pain. No issues overnight. Pt will be able to change his own dressings. He lives alone.   Objective: Vital signs in last 24 hours: Temp:  [98 F (36.7 C)-98.8 F (37.1 C)] 98.5 F (36.9 C) (09/27 0455) Pulse Rate:  [84-106] 87 (09/27 0455) Resp:  [12-24] 16 (09/27 0455) BP: (123-140)/(79-95) 126/88 (09/27 0455) SpO2:  [95 %-100 %] 99 % (09/27 0455) Weight:  [98 kg] 98 kg (09/26 0818) Last BM Date: 05/13/18  Intake/Output from previous day: 09/26 0701 - 09/27 0700 In: 1550 [P.O.:600; I.V.:950] Out: 650 [Urine:650] Intake/Output this shift: No intake/output data recorded.  PE: Gen:  Alert, NAD, pleasant, cooperative Pulm:  Rate and effort normal GU: groin wound without purulent drainage, some bleeding, penrose in place, induration and erythema improved, pt tolerated packing well. See photo below Skin: no rashes noted, warm and dry      Anti-infectives: Anti-infectives (From admission, onward)   Start     Dose/Rate Route Frequency Ordered Stop   05/11/18 0600  cefTRIAXone (ROCEPHIN) 2 g in sodium chloride 0.9 % 100 mL IVPB     2 g 200 mL/hr over 30 Minutes Intravenous Every 24 hours 05/11/18 0506     05/11/18 0530  metroNIDAZOLE (FLAGYL) IVPB 500 mg     500 mg 100 mL/hr over 60 Minutes Intravenous Every 6 hours 05/11/18 0506     05/09/18 1000  cefTRIAXone (ROCEPHIN) 1 g in  sodium chloride 0.9 % 100 mL IVPB  Status:  Discontinued     1 g 200 mL/hr over 30 Minutes Intravenous Every 24 hours 05/09/18 0921 05/11/18 0506   05/08/18 1300  clindamycin (CLEOCIN) IVPB 600 mg  Status:  Discontinued     600 mg 100 mL/hr over 30 Minutes Intravenous Every 8 hours 05/08/18 1252 05/09/18 0921   05/08/18 0945  vancomycin (VANCOCIN) 2,000 mg in sodium chloride 0.9 % 500 mL IVPB     2,000 mg 250 mL/hr over 120 Minutes Intravenous  Once 05/08/18 0937 05/08/18 1313      Lab Results:  Recent Labs    05/12/18 0548 05/13/18 0601  WBC 14.4* 18.6*  HGB 15.0 14.4  HCT 44.0 43.3  PLT 586* 585*   BMET Recent Labs    05/12/18 0548 05/13/18 0601  NA 139 139  K 4.4 4.1  CL 105 105  CO2 26 25  GLUCOSE 137* 156*  BUN 18 20  CREATININE 0.95 0.96  CALCIUM 8.6* 8.3*   PT/INR No results for input(s): LABPROT, INR in the last 72 hours. CMP     Component Value Date/Time   NA 139 05/13/2018 0601   K 4.1 05/13/2018 0601   CL 105 05/13/2018 0601   CO2 25 05/13/2018 0601   GLUCOSE 156 (H) 05/13/2018 0601   BUN 20 05/13/2018 0601   CREATININE 0.96 05/13/2018 0601   CALCIUM 8.3 (L) 05/13/2018 0601  PROT 7.5 05/08/2018 1008   ALBUMIN 3.2 (L) 05/08/2018 1008   AST 18 05/08/2018 1008   ALT 15 05/08/2018 1008   ALKPHOS 96 05/08/2018 1008   BILITOT 0.9 05/08/2018 1008   GFRNONAA >60 05/13/2018 0601   GFRAA >60 05/13/2018 0601   Lipase  No results found for: LIPASE  Studies/Results: No results found.    Kalman Drape , [facility] 05/13/2018, 8:00 AM  Pager: [phone] Mon-Wed, Friday 7:00am-4:30pm Thurs 7am-11:30am  Consults: [phone]

## 2018-05-13 NOTE — Progress Notes (Addendum)
Inpatient Diabetes Program Recommendations  AACE/ADA: New Consensus Statement on Inpatient Glycemic Control (2015)  Target Ranges:  Prepandial:   less than 140 mg/dL      Peak postprandial:   less than 180 mg/dL (1-2 hours)      Critically ill patients:  140 - 180 mg/dL   Results for LONNEL, GJERDE (MRN [id number]) as of 05/13/2018 13:49  Ref. Range 05/12/2018 07:31 05/12/2018 11:12 05/12/2018 11:52 05/12/2018 15:47 05/12/2018 20:54  Glucose-Capillary Latest Ref Range: 70 - 99 mg/dL 134 (H) 131 (H) 145 (H) 215 (H) 214 (H)   Results for KARMELLO, ABERCROMBIE (MRN [id number]) as of 05/13/2018 13:49  Ref. Range 05/13/2018 07:57 05/13/2018 12:17  Glucose-Capillary Latest Ref Range: 70 - 99 mg/dL 128 (H) 168 (H)   Results for JAVONTAE, MARLETTE (MRN [id number]) as of 05/13/2018 13:49  Ref. Range 01/27/2018 17:25 05/09/2018 05:48  Hemoglobin A1C Latest Ref Range: 4.8 - 5.6 % 11.0 (H) 5.2  Average glucose 102 mg/dl    Admit with: Cellulitis/ Sepsis secondary to severe left thigh complex abscesses   History: DM  Home DM Meds: Lantus 25 units QHS       Humalog 3 units TID with meals       Metformin 1000 mg BID  Current Orders: Lantus 15 units QHS      Novolog Sensitive Correction Scale/ SSI (0-9 units) TID AC + HS     Just diagnosed with Diabetes back in June of this year.    Entry A1c was 11%.  Current A1c way down to 5.2%.  Seen by the Diabetes Educator at the [facility] center on 03/09/2018 for DM education.  PCP: Dr. Grier Mitts   Has Freestyle Libre meter at home for [facility] checks.  CBGs well controlled in hospital.     Addendum 2:20pm- Met with pt today to discuss current A1c of 5.2%.  Inquired if pt is having any Hypoglycemic events at home.  Pt stated he uses a Freestyle Libre CGM for blood sugar checks at home and can see all readings throughout the day even if he doesn't scan his sensor.  Stated the lowest reading he has had has been in  the 80 mg/dl range.  Most often CBGs running in the 90's in the night hours.  Usually eats a peanut butter sandwich before bedtime to prevent lows in the middle of the night.  Reviewed signs and symptoms of Hypoglycemia with pt and proper treatment.  Pt stated he understands these symptoms but has never had a low event or Hypoglycemic symptoms since diagnosis.  Sees Dr. Volanda Napoleon with [facility] for DM management.    Encouraged pt to continue excellent management of CBGs at home.  Praised patient for such an amazing improvement in his A1c results over the last several months.  Encouraged pt to keep up with all his CBG readings and to call his PCP office if he starts to have issues with Hypoglycemia at home as he may need a downward adjustment of his insulin.  Pt stated understanding and stated appreciation for my visit with him.    --Will follow patient during hospitalization--  Wyn Quaker RN, MSN, CDE Diabetes Coordinator Inpatient Glycemic Control Team Team Pager: [phone] (8a-5p)

## 2018-05-13 NOTE — Care Management Note (Signed)
Case Management Note  Patient Details  Name: Erik Rojas MRN: [id number] Date of Birth: Jun 27, 1971  Subjective/Objective:                  rn for for wound care  Action/Plan: [facility]  Expected Discharge Date:                  Expected Discharge Plan:  [facility]  In-House Referral:     Discharge planning Services  CM Consult  Post Acute Care Choice:    Choice offered to:  Patient  DME Arranged:    DME Agency:     HH Arranged:  RN [place] Agency:  [facility]  Status of Service:  Completed, signed off  If discussed at [place] of Stay Meetings, dates discussed:    Additional Comments:  Leeroy Cha, RN 05/13/2018, 11:55 AM

## 2018-05-14 DIAGNOSIS — L03314 Cellulitis of groin: Secondary | ICD-10-CM

## 2018-05-14 DIAGNOSIS — L03116 Cellulitis of left lower limb: Secondary | ICD-10-CM

## 2018-05-14 LAB — GLUCOSE, CAPILLARY
GLUCOSE-CAPILLARY: 125 mg/dL — AB (ref 70–99)
Glucose-Capillary: 128 mg/dL — ABNORMAL HIGH (ref 70–99)

## 2018-05-14 LAB — LACTIC ACID, PLASMA: LACTIC ACID, VENOUS: 0.7 mmol/L (ref 0.5–1.9)

## 2018-05-14 LAB — CBC
HCT: 42.8 % (ref 39.0–52.0)
Hemoglobin: 14.5 g/dL (ref 13.0–17.0)
MCH: 30.4 pg (ref 26.0–34.0)
MCHC: 33.9 g/dL (ref 30.0–36.0)
MCV: 89.7 fL (ref 78.0–100.0)
PLATELETS: 554 10*3/uL — AB (ref 150–400)
RBC: 4.77 MIL/uL (ref 4.22–5.81)
RDW: 13.2 % (ref 11.5–15.5)
WBC: 13.8 10*3/uL — ABNORMAL HIGH (ref 4.0–10.5)

## 2018-05-14 MED ORDER — CLINDAMYCIN HCL 300 MG PO CAPS: 600.0000 mg | ORAL_CAPSULE | Freq: Three times a day (TID) | ORAL | 0 refills | Status: AC

## 2018-05-14 MED ORDER — OXYCODONE HCL 5 MG PO TABS: 5.0000 mg | ORAL_TABLET | ORAL | 0 refills | Status: DC | PRN

## 2018-05-14 MED ORDER — SACCHAROMYCES BOULARDII 250 MG PO CAPS: 250.0000 mg | ORAL_CAPSULE | Freq: Two times a day (BID) | ORAL | 0 refills | Status: AC

## 2018-05-14 MED ORDER — SACCHAROMYCES BOULARDII 250 MG PO CAPS
250.0000 mg | ORAL_CAPSULE | Freq: Two times a day (BID) | ORAL | Status: DC
  Administered 2018-05-14: 250 mg via ORAL
  Filled 2018-05-14: qty 1

## 2018-05-14 MED ORDER — DOXYCYCLINE HYCLATE 100 MG PO TABS
100.0000 mg | ORAL_TABLET | Freq: Two times a day (BID) | ORAL | Status: DC
  Administered 2018-05-14: 100 mg via ORAL
  Filled 2018-05-14: qty 1

## 2018-05-14 NOTE — Plan of Care (Signed)
  Problem: Activity: Goal: Risk for activity intolerance will decrease Outcome: Progressing   Problem: Nutrition: Goal: Adequate nutrition will be maintained Outcome: Progressing   Problem: Elimination: Goal: Will not experience complications related to bowel motility Outcome: Progressing   

## 2018-05-14 NOTE — Discharge Instructions (Signed)
[facility]  POST OP INSTRUCTIONS Always review your discharge instruction sheet given to you by the facility where your surgery was performed. IF YOU HAVE DISABILITY OR FAMILY LEAVE FORMS, YOU MUST BRING THEM TO THE OFFICE FOR PROCESSING.   DO NOT GIVE THEM TO YOUR DOCTOR.  PAIN CONTROL  1. First take acetaminophen (Tylenol) AND/or ibuprofen (Advil) to control your pain after surgery.  Follow directions on package.  Taking acetaminophen (Tylenol) and/or ibuprofen (Advil) regularly after surgery will help to control your pain and lower the amount of prescription pain medication you may need.  You should not take more than 4,000 mg (4 grams) of acetaminophen (Tylenol) in 24 hours.  You should not take ibuprofen (Advil), aleve, motrin, naprosyn or other NSAIDS if you have a history of stomach ulcers or chronic kidney disease.  2. A prescription for pain medication may be given to you upon discharge.  Take your pain medication as prescribed, if you still have uncontrolled pain after taking acetaminophen (Tylenol) or ibuprofen (Advil). 3. Use ice packs to help control pain. 4. If you need a refill on your pain medication, please contact your pharmacy.  They will contact our office to request authorization. Prescriptions will not be filled after 5pm or on week-ends.  HOME MEDICATIONS 5. Take your usually prescribed medications unless otherwise directed.  DIET 6. You should follow a light diet the first few days after arrival home.  Be sure to include lots of fluids daily. Avoid fatty, fried foods.   CONSTIPATION 7. It is common to experience some constipation after surgery and if you are taking pain medication.  Increasing fluid intake and taking a stool softener (such as Colace) will usually help or prevent this problem from occurring.  A mild laxative (Milk of Magnesia or Miralax) should be taken according to package instructions if there are no bowel movements after 48  hours.  WOUND CARE  It is important that the wound be kept open.   -Keeping the skin edges apart will allow the wound to gradually heal from the base upwards.   - If the skin edges of the wound close too early, a new fluid pocket can form and infection can occur. -This is the reason to pack deeper wounds with gauze or ribbon -This is why drained wounds cannot be sewed closed right away  A healthy wound should form a lining of bright red "beefy" granulating tissue that will help shrink the wound and help the edges grow new skin into it.   -A little mucus / yellow discharge is normal (the body's natural way to try and form a scab) and should be gently washed off with soap and water with daily dressing changes.  -Green or foul smelling drainage implies bacterial colonization and can slow wound healing - a short course of antibiotic ointment (3-5 days) can help it clear up.  Call the doctor if it does not improve or worsens  -Avoid use of antibiotic ointments for more than a week as they can slow wound healing over time.    -Sometimes other wound care products will be used to reduce need for dressing changes and/or help clean up dirty wounds -Sometimes the surgeon needs to debride the wound in the office to remove dead or infected tissue out of the wound so it can heal more quickly and safely.    Change the dressing at least once a day -Wash the wound with mild soap and water gently every day.  It is good to shower or bathe the wound to help it clean out. -Use clean 4x4 gauze for medium/large wounds or ribbon plain NU-gauze for smaller wounds (it does not need to be sterile, just clean) -Keep the raw wound moist with a little saline or KY (saline) gel on the gauze.  -A dry wound will take longer to heal.  -Keep the skin dry around the wound to prevent breakdown and irritation. -Pack the wound down to the base -The goal is to keep the skin apart, not overpack the wound -Use a Q-tip or  blunt-tipped kabob stick toothpick to push the gauze down to the base in narrow or deep wounds   -Cover with a clean gauze and tape -paper or Medipore tape tend to be gentle on the skin -rotate the orientation of the tape to avoid repeated stress/trauma on the skin -using an ACE or Coban wrap on wounds on arms or legs can be used instead.  Complete all antibiotics through the entire prescription to help the infection heal and prevent new places of infection   Returning the see the surgeon is helpful to follow the healing process and help the wound close as fast as possible.  ACTIVITIES 8. You may resume regular (light) daily activities beginning the next day--such as daily self-care, walking, climbing stairs--gradually increasing activities as tolerated.  You may have sexual intercourse when it is comfortable.  Refrain from any heavy lifting or straining until approved by your doctor. a. You may drive when you are no longer taking prescription pain medication, you can comfortably wear a seatbelt, and you can safely maneuver your car and apply brakes.  FOLLOW-UP 9. You should see your doctor in the office for a follow-up appointment approximately 2-3 weeks after your surgery.  You should have been given your post-op/follow-up appointment when your surgery was scheduled.  If you did not receive a post-op/follow-up appointment, make sure that you call for this appointment within a day or two after you arrive home to insure a convenient appointment time.   WHEN TO CALL YOUR DOCTOR: 1. Fever over 101.0 2. Inability to urinate 3. Continued bleeding from incision. 4. Increased pain, redness, or drainage from the incision. 5. Increasing abdominal pain  The clinic staff is available to answer your questions during regular business hours.  Please dont hesitate to call and ask to speak to one of the nurses for clinical concerns.  If you have a medical emergency, go to the nearest emergency room or  call 911.  A surgeon from [facility] is always on call at the hospital. [address], [place], [place], [place]  [address] ? [address], [place], [place]   [address] [phone] ? [phone] ? FAX [phone] Web site: [email or web address]

## 2018-05-14 NOTE — Progress Notes (Signed)
05/14/18  1222  Spoke with Debbie case management [phone], per CM patient is setup with advance home care and is ready for discharge.

## 2018-05-14 NOTE — Progress Notes (Signed)
Patient ID: Erik Rojas, male   DOB: October 11, 1970, 47 y.o.   MRN: [id number]   Acute Care Surgery Service Progress Note:    Chief Complaint/Subjective: No c/o. Pain controlled Pt assisted in his wound care yesterday.   Objective: Vital signs in last 24 hours: Temp:  [98.4 F (36.9 C)-99 F (37.2 C)] 98.5 F (36.9 C) (09/28 0945) Pulse Rate:  [74-99] 93 (09/28 0945) Resp:  [16-24] 16 (09/28 0945) BP: (119-137)/(89-93) 137/93 (09/28 0945) SpO2:  [96 %-98 %] 98 % (09/28 0945) Last BM Date: 05/14/18  Intake/Output from previous day: 09/27 0701 - 09/28 0700 In: 63 [P.O.:480; IV Piggyback:100] Out: -  Intake/Output this shift: Total I/O In: 440 [P.O.:240; IV Piggyback:200] Out: 500 [Urine:500]  L groin/inguinal/prox thigh- Large open wound with counter incision further out laterally with penrose going thru both wounds Has cellulitis b/t both open wounds.  Wound bed ok - no necrotic tissue  Neuro: alert, nonfocal  Lab Results: CBC  Recent Labs    05/13/18 0601 05/14/18 0532  WBC 18.6* 13.8*  HGB 14.4 14.5  HCT 43.3 42.8  PLT 585* 554*   BMET Recent Labs    05/12/18 0548 05/13/18 0601  NA 139 139  K 4.4 4.1  CL 105 105  CO2 26 25  GLUCOSE 137* 156*  BUN 18 20  CREATININE 0.95 0.96  CALCIUM 8.6* 8.3*   LFT Hepatic Function Latest Ref Rng & Units 05/08/2018 01/28/2018  Total Protein 6.5 - 8.1 g/dL 7.5 5.8(L)  Albumin 3.5 - 5.0 g/dL 3.2(L) 2.2(L)  AST 15 - 41 U/L 18 17  ALT 0 - 44 U/L 15 16(L)  Alk Phosphatase 38 - 126 U/L 96 93  Total Bilirubin 0.3 - 1.2 mg/dL 0.9 1.1   PT/INR No results for input(s): LABPROT, INR in the last 72 hours. ABG No results for input(s): PHART, HCO3 in the last 72 hours.  Invalid input(s): PCO2, PO2  Studies/Results:  Anti-infectives: Anti-infectives (From admission, onward)   Start     Dose/Rate Route Frequency Ordered Stop   05/13/18 1400  ceFAZolin (ANCEF) IVPB 1 g/50 mL premix  Status:  Discontinued     1 g 100 mL/hr over 30 Minutes Intravenous Every 8 hours 05/13/18 1000 05/13/18 1002   05/13/18 1400  ceFAZolin (ANCEF) IVPB 2g/100 mL premix     2 g 200 mL/hr over 30 Minutes Intravenous Every 8 hours 05/13/18 1003     05/11/18 0600  cefTRIAXone (ROCEPHIN) 2 g in sodium chloride 0.9 % 100 mL IVPB  Status:  Discontinued     2 g 200 mL/hr over 30 Minutes Intravenous Every 24 hours 05/11/18 0506 05/13/18 0825   05/11/18 0530  metroNIDAZOLE (FLAGYL) IVPB 500 mg  Status:  Discontinued     500 mg 100 mL/hr over 60 Minutes Intravenous Every 6 hours 05/11/18 0506 05/13/18 0825   05/09/18 1000  cefTRIAXone (ROCEPHIN) 1 g in sodium chloride 0.9 % 100 mL IVPB  Status:  Discontinued     1 g 200 mL/hr over 30 Minutes Intravenous Every 24 hours 05/09/18 0921 05/11/18 0506   05/08/18 1300  clindamycin (CLEOCIN) IVPB 600 mg  Status:  Discontinued     600 mg 100 mL/hr over 30 Minutes Intravenous Every 8 hours 05/08/18 1252 05/09/18 0921   05/08/18 0945  vancomycin (VANCOCIN) 2,000 mg in sodium chloride 0.9 % 500 mL IVPB     2,000 mg 250 mL/hr over 120 Minutes Intravenous  Once 05/08/18 0937 05/08/18 1313  Medications: Scheduled Meds: . amLODipine  5 mg Oral Daily  . enoxaparin (LOVENOX) injection  40 mg Subcutaneous Q24H  . insulin aspart  0-5 Units Subcutaneous QHS  . insulin aspart  0-9 Units Subcutaneous TID WC  . insulin glargine  15 Units Subcutaneous Q2200  . lisinopril  10 mg Oral Daily   Continuous Infusions: .  ceFAZolin (ANCEF) IV Stopped (05/14/18 0618)   PRN Meds:.acetaminophen, HYDROmorphone (DILAUDID) injection, oxyCODONE  Assessment/Plan: Patient Active Problem List   Diagnosis Date Noted  . Cellulitis 05/08/2018  . Sepsis ([place]) 01/27/2018  . Scrotal abscess 01/27/2018  . New onset type 2 diabetes mellitus ([place]) 01/27/2018  . HTN (hypertension) 01/27/2018   Type II DM - per medicine  Cellulitis & abscess left upper thigh/groin - S/P I&D, irrigation and debridement  L upper thigh wound, Dr. Barry Dienes, 09/26 - qshift wet to dry dressing changes - culture MRSA  KFM:MCRF mod VTE: SCD's, lovenox VO:[id number] 09/22-09/23, Vanc once 09/22; Rocephin 09/23 >>Flagyl 09/25>> WBC 18.6 down to 13; no fever Foley:none Follow up:Dr. Barry Dienes 2 weeks  Plan:keep penrose in place, wet to dry packing to wound, follow up sensitivities for MRSA wound.  Disposition: can go home with Home health nursing since no fever, no signs of systemic illness, wound stable. Still needs abx for ongoing MRSA cellulitis. Can be switched to oral. Discussed wound care with. Wound care and f/u on chart. Sent in oxycodone Rx.   For questions - pls page dr Barry Dienes  LOS: 5 days    Leighton Ruff. Redmond Pulling, MD, FACS General, Bariatric, & Minimally Invasive Surgery [phone] [facility]

## 2018-05-14 NOTE — Progress Notes (Signed)
05/14/18  1400  Reviewed discharge instructions with patient. Patient verbalized understanding of discharge instructions. Copy of discharge instructions given to patient.

## 2018-05-14 NOTE — Discharge Summary (Signed)
Physician Discharge Summary  Teigen Bellin QQI:[id number] DOB: 04-Jun-1971 DOA: 05/08/2018  PCP: Billie Ruddy, MD  Admit date: 05/08/2018 Discharge date: 05/14/2018  Admitted From: Home Disposition: Home Recommendations for Outpatient Follow-up:  1. Follow up with PCP in 1-2 weeks 2. Please obtain BMP/CBC in one week 3. Follow-up with general surgery Dr. Barry Dienes  Home Health: RN Equipment/Devices none  Discharge Condition stable CODE STATUS: Full code Diet recommendation carb modified cardiac Brief/Interim Summary:47 y.o.malewith medical history significant ofhypertension, diabetes mellitus, who was recently hospitalized 3 months ago for testicular abscess which is now resolved, presents to the emergency room with chief complaint of left inguinal area and left thigh redness onset 5 days ago. His urologist which was seen for his prior scrotal abscess diagnosed with cellulitis and placed on Bactrim. Despite being on Bactrim he noticed that he is inguinal area and thigh redness has progressively gotten larger and larger. He did have a temp of 100 few days ago.   Admitted for left thigh cellulitis.  Discharge Diagnoses:  Active Problems:   Cellulitis  #1 sepsis secondary to severe left thigh complex abscess status post I&D by Dr. Barry Dienes.  Wound culture positive for MRSA.  Blood cultures negative.  Will start patient on clindamycin 600 mg every 8 for 10 days upon discharge.  Will get home health RN for wound care.  Patient has a large open wound left groin proximal thigh with Penrose in place.  Wet-to-dry dressings twice a day.  Follow-up with general surgery.  Follow-up with PCP.  I have added probiotics continue.  #2 hypertension continue amlodipine and lisinopril.  #3 type 2 diabetes continue home dose of insulin and metformin.  Discharge Instructions  Discharge Instructions    Diet - low sodium heart healthy   Complete by:  As directed    Increase activity slowly    Complete by:  As directed      Allergies as of 05/14/2018   No Known Allergies     Medication List    STOP taking these medications   fluconazole 200 MG tablet Commonly known as:  DIFLUCAN   insulin aspart 100 UNIT/ML FlexPen Commonly known as:  NOVOLOG   sulfamethoxazole-trimethoprim 800-160 MG tablet Commonly known as:  BACTRIM DS,SEPTRA DS     TAKE these medications   acetaminophen 325 MG tablet Commonly known as:  TYLENOL Take 2 tablets (650 mg total) by mouth every 6 (six) hours as needed for mild pain (or Fever >/= 101).   amLODipine 5 MG tablet Commonly known as:  NORVASC Take 1 tablet (5 mg total) by mouth daily. For BP   blood glucose meter kit and supplies Relion Prime or Dispense other brand based on patient and insurance preference. Use up to four times daily as directed. (FOR ICD-9 250.00, 250.01).   clindamycin 300 MG capsule Commonly known as:  CLEOCIN Take 2 capsules (600 mg total) by mouth 3 (three) times daily for 10 days.   FREESTYLE LIBRE 14 DAY READER Devi 1 each by Does not apply route daily. And PRN.   FREESTYLE LIBRE 14 DAY SENSOR Misc Place 1 each onto the skin every 14 (fourteen) days. And PRN.   glucose blood test strip Use as instructed   insulin lispro 100 UNIT/ML injection Commonly known as:  HUMALOG Inject 3 Units into the skin 3 (three) times daily before meals.   LANTUS SOLOSTAR 100 UNIT/ML Solostar Pen Generic drug:  Insulin Glargine INJECT 25 UNITS INTO THE SKIN DAILY AT 10 PM. INSULIN  lisinopril 10 MG tablet Commonly known as:  PRINIVIL,ZESTRIL Take 1 tablet (10 mg total) by mouth daily. For BP.... Start on 02/08/18 after you Complete Bactrim (Sulfa) antibiotic   metFORMIN 1000 MG tablet Commonly known as:  GLUCOPHAGE Take 1 tablet (1,000 mg total) by mouth 2 (two) times daily with a meal. For Diabetes   onetouch ultrasoft lancets Check glucose three times daily more for Hypoglycemia and Hyperglycemia.   oxyCODONE 5  MG immediate release tablet Commonly known as:  Oxy IR/ROXICODONE Take 1 tablet (5 mg total) by mouth every 4 (four) hours as needed for severe pain or breakthrough pain.   Pen Needles 3/16" 31G X 5 MM Misc Used to inject insulin with meals as advised- insulin pen needles (#[id number]), Dx 250.02   saccharomyces boulardii 250 MG capsule Commonly known as:  FLORASTOR Take 1 capsule (250 mg total) by mouth 2 (two) times daily.      Follow-up Information    Stark Klein, MD. Call in 2 week(s).   Specialty:  General Surgery Why:  for postoperative follow-up and drain removal Contact information: [place] [address] [phone]          No Known Allergies  Consultations:  Dr. Barry Dienes   Procedures/Studies: Ct Pelvis W Contrast  Result Date: 05/08/2018 CLINICAL DATA:  Inguinal and proximal thigh cellulitis. History of scrotal abscess. Evaluate for recurrent abscess. EXAM: CT PELVIS WITH CONTRAST TECHNIQUE: Multidetector CT imaging of the pelvis was performed using the standard protocol following the bolus administration of intravenous contrast. CONTRAST:  147m ISOVUE-300 IOPAMIDOL (ISOVUE-300) INJECTION 61% COMPARISON:  CT pelvis 01/27/2018. FINDINGS: Urinary Tract: The visualized distal ureters and bladder appear unremarkable. Bowel: No bowel wall thickening, distention or surrounding inflammation identified within the pelvis. The appendix appears normal. Vascular/Lymphatic: No significant vascular findings. There are several prominent left pelvic lymph nodes which are likely reactive. The largest nodes include left external iliac nodes measuring 10 mm on image 70/3 and 12 mm on image 83/3. No significant inguinal adenopathy. Reproductive: The prostate gland and seminal vesicles appear normal. There are calcifications of the vas deferens bilaterally. The scrotum appears normal without recurrent soft tissue emphysema or a hydrocele. Other: Small left inguinal  hernia containing only fat. Musculoskeletal: There is new diffuse subcutaneous edema anteriorly in the left lower pelvis and anteromedially in the proximal left thigh. The distal extent of this is not imaged by this examination. There is no focal fluid collection, foreign body or soft tissue emphysema. The proximal left thigh musculature appears normal. No evidence of hip joint effusion, fracture or osteomyelitis. 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 IMPRESSION: 1. Extensive soft tissue edema in the anterior left pelvis and proximal left thigh consistent with cellulitis. No evidence of abscess or myositis. 2. No evidence of recurrent scrotal infection. No soft tissue emphysema. 3. Probable reactive left pelvic adenopathy. Electronically Signed   By: WRichardean SaleM.D.   On: 05/08/2018 12:11   Mr Pelvis W Wo Contrast  Result Date: 05/10/2018 CLINICAL DATA:  Left groin infection EXAM: MRI PELVIS WITHOUT AND WITH CONTRAST TECHNIQUE: Multiplanar multisequence MR imaging of the pelvis was performed both before and after administration of intravenous contrast. CONTRAST:  9 mL Gadavist COMPARISON:  None. FINDINGS: Urinary Tract: The bladder is unremarkable for the degree of distention. No intraluminal mass. Hydroureter. Bowel:  Unremarkable visualized pelvic bowel loops. Vascular/Lymphatic: Enlarged left inguinal nodes measuring up to 13 mm short axis likely reactive. Reproductive:  Small fat containing inguinal hernia. Prostate and seminal vesicles are unremarkable. Testicles demonstrate no acute abnormality or mass. Other:  None Musculoskeletal: Diffuse subcutaneous soft tissue edema consistent with cellulitis of the left hip and thigh is noted with smaller subcutaneous foci of abnormal enhancement along the anterior aspect of the left hip tracking inferomedial along the medial left thigh, the greatest collection measuring at least 7.8 x 2.5 x 6 cm. Mild myositis of the  abductor and pectineus muscles without definite evidence of pyomyositis. No marrow signal abnormalities to suggest osteomyelitis. No fracture. IMPRESSION: 1. Moderate degree of inflammatory soft tissue thickening and edema of the left hip and thigh with tracking fluid along the anteromedial aspect of the thigh, the greatest collection measuring 7.8 x 2.5 x 6 cm medially consistent with soft tissue abscesses. 2. Myositis of the adductor and pectineus muscles on the left. Electronically Signed   By: Ashley Royalty M.D.   On: 05/10/2018 18:58    (Echo, Carotid, EGD, Colonoscopy, ERCP)    Subjective:   Discharge Exam: Vitals:   05/14/18 0524 05/14/18 0945  BP: 119/89 (!) 137/93  Pulse: 74 93  Resp: 20 16  Temp: 98.4 F (36.9 C) 98.5 F (36.9 C)  SpO2: 97% 98%   Vitals:   05/13/18 1358 05/13/18 2047 05/14/18 0524 05/14/18 0945  BP: 122/90 131/89 119/89 (!) 137/93  Pulse: 99 88 74 93  Resp: (!) 24 20 20 16   Temp: 98.5 F (36.9 C) 99 F (37.2 C) 98.4 F (36.9 C) 98.5 F (36.9 C)  TempSrc:  Oral Oral Oral  SpO2: 96% 98% 97% 98%  Weight:      Height:        General: Pt is alert, awake, not in acute distress Cardiovascular: RRR, S1/S2 +, no rubs, no gallops Respiratory: CTA bilaterally, no wheezing, no rhonchi Abdominal: Soft, NT, ND, bowel sounds + Extremities: Left upper inner thigh dressings on with surrounding cellulitis.    The results of significant diagnostics from this hospitalization (including imaging, microbiology, ancillary and laboratory) are listed below for reference.     Microbiology: Recent Results (from the past 240 hour(s))  Blood culture (routine x 2)     Status: None   Collection Time: 05/08/18  9:50 AM  Result Value Ref Range Status   Specimen Description   Final    BLOOD LEFT ANTECUBITAL Performed at [facility] [address], [place], [place] [address]    Special Requests   Final    BOTTLES DRAWN AEROBIC AND ANAEROBIC Blood  Culture adequate volume Performed at [facility] [address], [place], [place] [address]    Culture   Final    NO GROWTH 5 DAYS Performed at [facility], [place] [address] [place], [place] [address]    Report Status 05/13/2018 FINAL  Final  Blood culture (routine x 2)     Status: None   Collection Time: 05/08/18  9:58 AM  Result Value Ref Range Status   Specimen Description   Final    BLOOD RIGHT ANTECUBITAL Performed at [facility] [address] [place], [place] [address]    Special Requests   Final    BOTTLES DRAWN AEROBIC AND ANAEROBIC Blood Culture adequate volume Performed at [facility] [address], [place], [place] [address]    Culture   Final    NO GROWTH 5 DAYS Performed at [facility], [place] [address] [place], [place] [address]  Report Status 05/13/2018 FINAL  Final  MRSA PCR Screening     Status: None   Collection Time: 05/09/18  8:28 AM  Result Value Ref Range Status   MRSA by PCR NEGATIVE NEGATIVE Final    Comment:        The GeneXpert MRSA Assay (FDA approved for NASAL specimens only), is one component of a comprehensive MRSA colonization surveillance program. It is not intended to diagnose MRSA infection nor to guide or monitor treatment for MRSA infections. Performed at [facility], [place] [address], [place], [place] [address]   Aerobic/Anaerobic Culture (surgical/deep wound)     Status: None (Preliminary result)   Collection Time: 05/12/18 10:10 AM  Result Value Ref Range Status   Specimen Description   Final    ABSCESS LEFT THIGH Performed at [facility] [address], [place], [place] [address]    Special Requests   Final    NONE Performed at [facility], [place] [address] [place], [place] [address]    Gram Stain   Final    MODERATE WBC PRESENT, PREDOMINANTLY PMN MODERATE GRAM POSITIVE COCCI Performed at  [facility], [place] [address] [place], [place] [address]    Culture   Final    ABUNDANT METHICILLIN RESISTANT STAPHYLOCOCCUS AUREUS NO ANAEROBES ISOLATED; CULTURE IN PROGRESS FOR 5 DAYS    Report Status PENDING  Incomplete   Organism ID, Bacteria METHICILLIN RESISTANT STAPHYLOCOCCUS AUREUS  Final      Susceptibility   Methicillin resistant staphylococcus aureus - MIC*    CIPROFLOXACIN <=0.5 SENSITIVE Sensitive     ERYTHROMYCIN >=8 RESISTANT Resistant     GENTAMICIN <=0.5 SENSITIVE Sensitive     OXACILLIN >=4 RESISTANT Resistant     TETRACYCLINE <=1 SENSITIVE Sensitive     VANCOMYCIN <=0.5 SENSITIVE Sensitive     TRIMETH/SULFA <=10 SENSITIVE Sensitive     CLINDAMYCIN <=0.25 SENSITIVE Sensitive     RIFAMPIN <=0.5 SENSITIVE Sensitive     Inducible Clindamycin NEGATIVE Sensitive     * ABUNDANT METHICILLIN RESISTANT STAPHYLOCOCCUS AUREUS     Labs: BNP (last 3 results) No results for input(s): BNP in the last 8760 hours. Basic Metabolic Panel: Recent Labs  Lab 05/08/18 1008 05/09/18 0548 05/10/18 0551 05/12/18 0548 05/13/18 0601  NA 138 138 141 139 139  K 3.9 4.0 4.3 4.4 4.1  CL 101 103 104 105 105  CO2 22 27 28 26 25   GLUCOSE 117* 87 92 137* 156*  BUN 17 13 13 18 20   CREATININE 1.06 1.00 0.92 0.95 0.96  CALCIUM 8.8* 8.1* 8.5* 8.6* 8.3*  MG  --   --   --  1.8 1.9  PHOS  --   --   --  3.7 3.4   Liver Function Tests: Recent Labs  Lab 05/08/18 1008  AST 18  ALT 15  ALKPHOS 96  BILITOT 0.9  PROT 7.5  ALBUMIN 3.2*   No results for input(s): LIPASE, AMYLASE in the last 168 hours. No results for input(s): AMMONIA in the last 168 hours. CBC: Recent Labs  Lab 05/08/18 1008  05/10/18 0551 05/11/18 0557 05/12/18 0548 05/13/18 0601 05/14/18 0532  WBC 20.2*   < > 15.0* 15.3* 14.4* 18.6* 13.8*  NEUTROABS 16.8*  --   --   --   --   --   --   HGB 16.2   < > 13.9 14.7 15.0 14.4 14.5  HCT 46.1   < > 41.1 43.3 44.0  43.3 42.8  MCV 87.1   < > 89.0 89.3 89.2 90.2 89.7   PLT 483*   < > 515* 566* 586* 585* 554*   < > = values in this interval not displayed.   Cardiac Enzymes: No results for input(s): CKTOTAL, CKMB, CKMBINDEX, TROPONINI in the last 168 hours. BNP: Invalid input(s): POCBNP CBG: Recent Labs  Lab 05/13/18 1217 05/13/18 1637 05/13/18 2048 05/14/18 0749 05/14/18 1155  GLUCAP 168* 160* 145* 128* 125*   D-Dimer No results for input(s): DDIMER in the last 72 hours. Hgb A1c No results for input(s): HGBA1C in the last 72 hours. Lipid Profile No results for input(s): CHOL, HDL, LDLCALC, TRIG, CHOLHDL, LDLDIRECT in the last 72 hours. Thyroid function studies No results for input(s): TSH, T4TOTAL, T3FREE, THYROIDAB in the last 72 hours.  Invalid input(s): FREET3 Anemia work up No results for input(s): VITAMINB12, FOLATE, FERRITIN, TIBC, IRON, RETICCTPCT in the last 72 hours. Urinalysis    Component Value Date/Time   COLORURINE YELLOW 01/28/2018 1900   APPEARANCEUR CLEAR 01/28/2018 1900   LABSPEC 1.036 (H) 01/28/2018 1900   PHURINE 5.0 01/28/2018 1900   GLUCOSEU >=500 (A) 01/28/2018 1900   HGBUR NEGATIVE 01/28/2018 1900   BILIRUBINUR NEGATIVE 01/28/2018 1900   KETONESUR 5 (A) 01/28/2018 1900   PROTEINUR NEGATIVE 01/28/2018 1900   NITRITE NEGATIVE 01/28/2018 1900   LEUKOCYTESUR NEGATIVE 01/28/2018 1900   Sepsis Labs Invalid input(s): PROCALCITONIN,  WBC,  LACTICIDVEN Microbiology Recent Results (from the past 240 hour(s))  Blood culture (routine x 2)     Status: None   Collection Time: 05/08/18  9:50 AM  Result Value Ref Range Status   Specimen Description   Final    BLOOD LEFT ANTECUBITAL Performed at [facility], [place] [address], [place], [place] [address]    Special Requests   Final    BOTTLES DRAWN AEROBIC AND ANAEROBIC Blood Culture adequate volume Performed at [facility] [address] [place], [place] [address]    Culture   Final    NO GROWTH 5 DAYS Performed at [facility], [place] [address], [place], [place] [address]    Report Status 05/13/2018 FINAL  Final  Blood culture (routine x 2)     Status: None   Collection Time: 05/08/18  9:58 AM  Result Value Ref Range Status   Specimen Description   Final    BLOOD RIGHT ANTECUBITAL Performed at [facility] [address], [place], [place] [address]    Special Requests   Final    BOTTLES DRAWN AEROBIC AND ANAEROBIC Blood Culture adequate volume Performed at [facility] [address] [place], [place] [address]    Culture   Final    NO GROWTH 5 DAYS Performed at [facility], [place] [address] [place], [place] [address]    Report Status 05/13/2018 FINAL  Final  MRSA PCR Screening     Status: None   Collection Time: 05/09/18  8:28 AM  Result Value Ref Range Status   MRSA by PCR NEGATIVE NEGATIVE Final    Comment:        The GeneXpert MRSA Assay (FDA approved for NASAL specimens only), is one component of a comprehensive MRSA colonization surveillance program. It is not intended to diagnose MRSA infection nor to guide or monitor treatment for MRSA infections. Performed at [facility], [place] [address] [place], [place] [address]   Aerobic/Anaerobic Culture (surgical/deep wound)  Status: None (Preliminary result)   Collection Time: 05/12/18 10:10 AM  Result Value Ref Range Status   Specimen Description   Final    ABSCESS LEFT THIGH Performed at [facility] [address], [place], [place] [address]    Special Requests   Final    NONE Performed at [facility], [place] [address] [place], [place] [address]    Gram Stain   Final    MODERATE WBC PRESENT, PREDOMINANTLY PMN MODERATE GRAM POSITIVE COCCI Performed at [facility], [place] [address] [place], [place] [address]    Culture   Final    ABUNDANT METHICILLIN RESISTANT STAPHYLOCOCCUS AUREUS NO ANAEROBES ISOLATED; CULTURE IN  PROGRESS FOR 5 DAYS    Report Status PENDING  Incomplete   Organism ID, Bacteria METHICILLIN RESISTANT STAPHYLOCOCCUS AUREUS  Final      Susceptibility   Methicillin resistant staphylococcus aureus - MIC*    CIPROFLOXACIN <=0.5 SENSITIVE Sensitive     ERYTHROMYCIN >=8 RESISTANT Resistant     GENTAMICIN <=0.5 SENSITIVE Sensitive     OXACILLIN >=4 RESISTANT Resistant     TETRACYCLINE <=1 SENSITIVE Sensitive     VANCOMYCIN <=0.5 SENSITIVE Sensitive     TRIMETH/SULFA <=10 SENSITIVE Sensitive     CLINDAMYCIN <=0.25 SENSITIVE Sensitive     RIFAMPIN <=0.5 SENSITIVE Sensitive     Inducible Clindamycin NEGATIVE Sensitive     * ABUNDANT METHICILLIN RESISTANT STAPHYLOCOCCUS AUREUS     Time coordinating discharge: 35 minutes  SIGNED:   Georgette Shell, MD  [facility] 05/14/2018, 1:21 PM Pager   If 7PM-7AM, please contact night-coverage [email or web address] Password [email or web address]

## 2018-05-16 ENCOUNTER — Telehealth: Payer: Self-pay | Admitting: *Deleted

## 2018-05-16 NOTE — Telephone Encounter (Signed)
Transition Care Management Follow-up Telephone Call  Per Discharge Summary: Admit date: 05/08/2018 Discharge date: 05/14/2018  Admitted From: Home Disposition: Home Recommendations for Outpatient Follow-up:  1. Follow up with PCP in 1-2 weeks 2. Please obtain BMP/CBC in one week 3. Follow-up with general surgery Dr. Barry Dienes  Home Health: RN Equipment/Devices none  Discharge Condition stable CODE STATUS: Full code Diet recommendation carb modified cardiac  --   How have you been since you were released from the hospital? "I've been doing fine. I just set up the appointment for the home care person. She's supposed to call me after 5."   Do you understand why you were in the hospital? yes   Do you understand the discharge instructions? yes   Where were you discharged to? Home   Items Reviewed:  Medications reviewed: yes  Allergies reviewed: yes  Dietary changes reviewed: yes  Referrals reviewed: yes, home health for wound care   Functional Questionnaire:   Activities of Daily Living (ADLs):   He states they are independent in the following: ambulation, bathing and hygiene, feeding, continence, grooming, toileting and dressing States they require assistance with the following: none   Any transportation issues/concerns?: no   Any patient concerns? no   Confirmed importance and date/time of follow-up visits scheduled yes  Provider Appointment booked with Dr. Grier Mitts 05/26/18 @ 11:00am  Confirmed with patient if condition begins to worsen call PCP or go to the ER.  Patient was given the office number and encouraged to call back with question or concerns.  : yes

## 2018-05-16 NOTE — Progress Notes (Signed)
This chart was accessed due to patient having questions regarding his discharge.

## 2018-05-17 ENCOUNTER — Telehealth: Payer: Self-pay | Admitting: Family Medicine

## 2018-05-17 DIAGNOSIS — L03116 Cellulitis of left lower limb: Secondary | ICD-10-CM | POA: Diagnosis not present

## 2018-05-17 DIAGNOSIS — L03314 Cellulitis of groin: Secondary | ICD-10-CM | POA: Diagnosis not present

## 2018-05-17 DIAGNOSIS — L02416 Cutaneous abscess of left lower limb: Secondary | ICD-10-CM | POA: Diagnosis not present

## 2018-05-17 LAB — AEROBIC/ANAEROBIC CULTURE W GRAM STAIN (SURGICAL/DEEP WOUND)

## 2018-05-17 LAB — AEROBIC/ANAEROBIC CULTURE (SURGICAL/DEEP WOUND)

## 2018-05-17 NOTE — Telephone Encounter (Signed)
Copied from [place] [id number]. Topic: General - Other >> May 17, 2018 12:58 PM Yvette Rack wrote: Reason for CRM: Nurse Sharyn Lull from [facility] [phone] calling for verbal order for skill nursing for 4 weeks for wound care

## 2018-05-17 NOTE — Telephone Encounter (Signed)
ok 

## 2018-05-17 NOTE — Telephone Encounter (Signed)
Please Advise if ok for verbal orders 

## 2018-05-19 NOTE — Telephone Encounter (Signed)
Spoke with Sharyn Lull with [facility] gave verbal orders for pt skilled nursing and wound care per dr Volanda Napoleon

## 2018-05-26 ENCOUNTER — Inpatient Hospital Stay: Payer: 59 | Admitting: Family Medicine

## 2018-05-27 DIAGNOSIS — L03116 Cellulitis of left lower limb: Secondary | ICD-10-CM | POA: Diagnosis not present

## 2018-05-27 DIAGNOSIS — L03314 Cellulitis of groin: Secondary | ICD-10-CM | POA: Diagnosis not present

## 2018-05-27 DIAGNOSIS — L02416 Cutaneous abscess of left lower limb: Secondary | ICD-10-CM | POA: Diagnosis not present

## 2018-06-03 ENCOUNTER — Telehealth: Payer: Self-pay | Admitting: Family Medicine

## 2018-06-03 DIAGNOSIS — L03314 Cellulitis of groin: Secondary | ICD-10-CM | POA: Diagnosis not present

## 2018-06-03 DIAGNOSIS — L03116 Cellulitis of left lower limb: Secondary | ICD-10-CM | POA: Diagnosis not present

## 2018-06-03 DIAGNOSIS — L02416 Cutaneous abscess of left lower limb: Secondary | ICD-10-CM | POA: Diagnosis not present

## 2018-06-03 NOTE — Telephone Encounter (Signed)
Copied from [place] [phone]. Topic: Quick Communication - Home Health Verbal Orders >> Jun 03, 2018  3:02 PM Yvette Rack wrote: Caller/Agency: Nurse Sharyn Lull from [facility] [phone] Callback Number: [phone] Requesting OT/PT/Skilled Nursing/Social Work: Home health  Frequency: once a week for 2 weeks for vitals and dressing

## 2018-06-06 NOTE — Telephone Encounter (Signed)
Ok

## 2018-06-06 NOTE — Telephone Encounter (Signed)
Spoke with Sharyn Lull with [facility] regarding q request for pt verbal orders for PT/OT which was approved by Dr Volanda Napoleon

## 2018-06-09 DIAGNOSIS — L03116 Cellulitis of left lower limb: Secondary | ICD-10-CM | POA: Diagnosis not present

## 2018-06-09 DIAGNOSIS — L02416 Cutaneous abscess of left lower limb: Secondary | ICD-10-CM | POA: Diagnosis not present

## 2018-06-09 DIAGNOSIS — L03314 Cellulitis of groin: Secondary | ICD-10-CM | POA: Diagnosis not present

## 2018-06-16 DIAGNOSIS — L02416 Cutaneous abscess of left lower limb: Secondary | ICD-10-CM | POA: Diagnosis not present

## 2018-06-16 DIAGNOSIS — L03314 Cellulitis of groin: Secondary | ICD-10-CM | POA: Diagnosis not present

## 2018-06-16 DIAGNOSIS — L03116 Cellulitis of left lower limb: Secondary | ICD-10-CM | POA: Diagnosis not present

## 2018-08-02 ENCOUNTER — Ambulatory Visit: Payer: 59 | Admitting: Family Medicine

## 2018-08-03 ENCOUNTER — Ambulatory Visit: Payer: 59 | Admitting: Family Medicine

## 2018-08-03 ENCOUNTER — Encounter: Payer: Self-pay | Admitting: Family Medicine

## 2018-08-03 ENCOUNTER — Other Ambulatory Visit: Payer: Self-pay

## 2018-08-03 VITALS — BP 126/80 | HR 108 | Temp 98.2°F | Wt 223.0 lb

## 2018-08-03 DIAGNOSIS — I10 Essential (primary) hypertension: Secondary | ICD-10-CM | POA: Diagnosis not present

## 2018-08-03 DIAGNOSIS — E119 Type 2 diabetes mellitus without complications: Secondary | ICD-10-CM | POA: Diagnosis not present

## 2018-08-03 MED ORDER — METFORMIN HCL 1000 MG PO TABS: 1000.0000 mg | ORAL_TABLET | Freq: Two times a day (BID) | ORAL | 5 refills | Status: DC

## 2018-08-03 MED ORDER — AMLODIPINE BESYLATE 5 MG PO TABS: 5.0000 mg | ORAL_TABLET | Freq: Every day | ORAL | 5 refills | Status: DC

## 2018-08-03 NOTE — Progress Notes (Signed)
Subjective:    Patient ID: Erik Rojas, male    DOB: [birth year], 47 y.o.   MRN: [id number]  No chief complaint on file.   HPI Patient was seen today for follow-up.  Diabetes type 2: -Pt taking Humalog 3 units TID, lantus 25 units qhs, and metformin 1000 mg BID. -pt monitoring his intake of carbs and sweets -using Freestyle Libre meter. -blood sugar well controlled.   -hgb A1c 5.2% on 05/09/2018 -walking for exercise  HTN: -Taking lisinopril 10 mg and Norvasc 5 mg -Patient notes taking medicine at night -BP well controlled -Drinking plenty of water daily. -Denies dizziness, fatigue.  After last OFV, pt was readmitted for left thigh cellulitis/abscess requiring surgical debridement on 05/09/2018.  Pt states that the wound healed well and he is no longer having issues.  Past Medical History:  Diagnosis Date  . HTN (hypertension)   . Scrotal abscess 01/28/2018   right  . Type II diabetes mellitus ([place]) 01/27/2018   "just dx'd" (01/28/2018)    No Known Allergies  ROS General: Denies fever, chills, night sweats, changes in weight, changes in appetite HEENT: Denies headaches, ear pain, changes in vision, rhinorrhea, sore throat CV: Denies CP, palpitations, SOB, orthopnea Pulm: Denies SOB, cough, wheezing GI: Denies abdominal pain, nausea, vomiting, diarrhea, constipation GU: Denies dysuria, hematuria, frequency, vaginal discharge Msk: Denies muscle cramps, joint pains Neuro: Denies weakness, numbness, tingling Skin: Denies rashes, bruising Psych: Denies depression, anxiety, hallucinations    Objective:    Blood pressure 126/80, pulse (!) 108, temperature 98.2 F (36.8 C), temperature source Oral, weight 223 lb (101.2 kg), SpO2 97 %.  Gen. Pleasant, well-nourished, in no distress, normal affect HEENT: [place]/AT, face symmetric, no scleral icterus, PERRLA, nares patent without drainage Lungs: no accessory muscle use, CTAB, no wheezes or rales Cardiovascular: RRR,  no m/r/g, no peripheral edema Neuro:  A&Ox3, CN II-XII intact, normal gait Skin:  Warm, no lesions/ rash  Wt Readings from Last 3 Encounters:  08/03/18 223 lb (101.2 kg)  05/12/18 216 lb (98 kg)  05/04/18 217 lb (98.4 kg)    Lab Results  Component Value Date   WBC 13.8 (H) 05/14/2018   HGB 14.5 05/14/2018   HCT 42.8 05/14/2018   PLT 554 (H) 05/14/2018   GLUCOSE 156 (H) 05/13/2018   ALT 15 05/08/2018   AST 18 05/08/2018   NA 139 05/13/2018   K 4.1 05/13/2018   CL 105 05/13/2018   CREATININE 0.96 05/13/2018   BUN 20 05/13/2018   CO2 25 05/13/2018   HGBA1C 5.2 05/09/2018    Assessment/Plan:  Essential hypertension -Controlled -Continue lisinopril 10 mg Norvasc 5 mg -Continue checking BP at home -Continue lifestyle modifications including increasing p.o. intake of water, decreasing sodium intake, increasing physical activity. -Given handout  Controlled type 2 diabetes mellitus without complication, without long-term current use of insulin (HCC) -Continue current insulin regimen of NovoLog 3 units 3 times daily, Lantus 25 units nightly, metformin 1000 mg twice daily. -last hgb A1C 5.2% -We will likely be able to reduce medication given tight control. -Patient encouraged to continue lifestyle modifications and daily monitoring of blood sugar.  Follow-up in the next 3 to 4 months, sooner if needed  Grier Mitts, MD

## 2018-08-03 NOTE — Patient Instructions (Signed)
Type 2 Diabetes Mellitus, Self Care, Adult  Caring for yourself after you have been diagnosed with type 2 diabetes (type 2 diabetes mellitus) means keeping your blood sugar (glucose) under control with a balance of:   Nutrition.   Exercise.   Lifestyle changes.   Medicines or insulin, if necessary.   Support from your team of health care providers and others.  The following information explains what you need to know to manage your diabetes at home.  What are the risks?  Having diabetes can put you at risk for other long-term (chronic) conditions, such as heart disease and kidney disease. Your health care provider may prescribe medicines to help prevent complications from diabetes. These medicines may include:   Aspirin.   Medicine to lower cholesterol.   Medicine to control blood pressure.  How to monitor blood glucose     Check your blood glucose every day, as often as told by your health care provider.   Have your A1c (hemoglobin A1c) level checked two or more times a year, or as often as told by your health care provider.  Your health care provider will set individualized treatment goals for you. Generally, the goal of treatment is to maintain the following blood glucose levels:   Before meals (preprandial): 80-130 mg/dL (4.4-7.2 mmol/L).   After meals (postprandial): below 180 mg/dL (10 mmol/L).   A1c level: less than 7%.  How to manage hyperglycemia and hypoglycemia  Hyperglycemia symptoms  Hyperglycemia, also called high blood glucose, occurs when blood glucose is too high. Make sure you know the early signs of hyperglycemia, such as:   Increased thirst.   Hunger.   Feeling very tired.   Needing to urinate more often than usual.   Blurry vision.  Hypoglycemia symptoms  Hypoglycemia, also called low blood glucose, occurswith a blood glucose level at or below 70 mg/dL (3.9 mmol/L). The risk for hypoglycemia increases during or after exercise, during sleep, during illness, and when skipping  meals or not eating for a long time (fasting).  It is important to know the symptoms of hypoglycemia and treat it right away. Always have a 15-gram rapid-acting carbohydrate snack with you to treat low blood glucose. Family members and close friends should also know the symptoms and should understand how to treat hypoglycemia, in case you are not able to treat yourself. Symptoms may include:   Hunger.   Anxiety.   Sweating and feeling clammy.   Confusion.   Dizziness or feeling light-headed.   Sleepiness.   Nausea.   Increased heart rate.   Headache.   Blurry vision.   Irritability.   A change in coordination.   Tingling or numbness around the mouth, lips, or tongue.   Restless sleep.   Fainting.   Seizure.  Treating hypoglycemia  If you are alert and able to swallow safely, follow the 15:15 rule:   Take 15 grams of a rapid-acting carbohydrate. Talk with your health care provider about how much you should take.   Rapid-acting options include:  ? Glucose pills (take 15 grams).  ? 6-8 pieces of hard candy.  ? 4-6 oz (120-150 mL) of fruit juice.  ? 4-6 oz (120-150 mL) of regular (not diet) soda.  ? 1 Tbsp (15 mL) honey or sugar.   Check your blood glucose 15 minutes after you take the carbohydrate.   If the repeat blood glucose level is still at or below 70 mg/dL (3.9 mmol/L), take 15 grams of a carbohydrate again.     If your blood glucose level does not increase above 70 mg/dL (3.9 mmol/L) after 3 tries, seek emergency medical care.   After your blood glucose level returns to normal, eat a meal or a snack within 1 hour.  Treating severe hypoglycemia  Severe hypoglycemia is when your blood glucose level is at or below 54 mg/dL (3 mmol/L). Severe hypoglycemia is an emergency. Do not wait to see if the symptoms will go away. Get medical help right away. Call your local emergency services (911 in the [place]  If you have severe hypoglycemia and you cannot eat or drink, you may need an injection of  glucagon. A family member or close friend should learn how to check your blood glucose and how to give you a glucagon injection. Ask your health care provider if you need to have an emergency glucagon injection kit available.  Severe hypoglycemia may need to be treated in a hospital. The treatment may include getting glucose through an IV. You may also need treatment for the cause of your hypoglycemia.  Follow these instructions at home:  Take diabetes medicines as told   If your health care provider prescribed insulin or diabetes medicines, take them every day.   Do not run out of insulin or other diabetes medicines that you take. Plan ahead so you always have these available.   If you use insulin, adjust your dosage based on how physically active you are and what foods you eat. Your health care provider will tell you how to adjust your dosage.  Make healthy food choices    The things that you eat and drink affect your blood glucose and your insulin dosage. Making good choices helps to control your diabetes and prevent other health problems. A healthy meal plan includes eating lean proteins, complex carbohydrates, fresh fruits and vegetables, low-fat dairy products, and healthy fats.  Make an appointment to see a diet and nutrition specialist (registered dietitian) to help you create an eating plan that is right for you. Make sure that you:   Follow instructions from your health care provider about eating or drinking restrictions.   Drink enough fluid to keep your urine pale yellow.   Keep a record of the carbohydrates that you eat. Do this by reading food labels and learning the standard serving sizes of foods.   Follow your sick day plan whenever you cannot eat or drink as usual. Make this plan in advance with your health care provider.    Stay active  Exercise regularly, as told by your health care provider. This may include:   Stretching and doing strength exercises, such as yoga or weightlifting, 2 or  more times a week.   Doing 150 minutes or more of moderate-intensity or vigorous-intensity exercise each week. This could be brisk walking, biking, or water aerobics.  ? Spread out your activity over 3 or more days of the week.  ? Do not go more than 2 days in a row without doing some kind of physical activity.  When you start a new exercise or activity, work with your health care provider to adjust your insulin, medicines, or food intake as needed.  Make healthy lifestyle choices   Do not use any tobacco products, such as cigarettes, chewing tobacco, and e-cigarettes. If you need help quitting, ask your health care provider.   If your health care provider says that alcohol is safe for you, limit alcohol intake to no more than 1 drink per day for nonpregnant women and   2 drinks per day for men. One drink equals 12 oz of beer (355 mL), 5 oz of wine (148 mL), or 1 oz of hard liquor (44 mL).   Learn to manage stress. If you need help with this, ask your health care provider.  Care for your body     Keep your immunizations up to date. In addition to getting vaccinations as told by your health care provider, it is recommended that you get vaccinated against the following illnesses:  ? The flu (influenza). Get a flu shot every year.  ? Pneumonia.  ? Hepatitis B.   Schedule an eye exam soon after your diagnosis, and then one time every year after that.   Check your skin and feet every day for cuts, bruises, redness, blisters, or sores. Schedule a foot exam with your health care provider once every year.   Brush your teeth and gums two times a day, and floss one or more times a day. Visit your dentist one or more times every 6 months.   Maintain a healthy weight.  General instructions   Take over-the-counter and prescription medicines only as told by your health care provider.   Share your diabetes management plan with people in your workplace, school, and household.   Carry a medical alert card or wear medical  alert jewelry.   Keep all follow-up visits as told by your health care provider. This is important.  Questions to ask your health care provider   Do I need to meet with a diabetes educator?   Where can I find a support group for people with diabetes?  Where to find more information  For more information about diabetes, visit:   American Diabetes Association (ADA): www.diabetes.org   American Association of Diabetes Educators (AADE): www.diabeteseducator.org  Summary   Caring for yourself after you have been diagnosed with (type 2 diabetes mellitus) means keeping your blood sugar (glucose) under control with a balance of nutrition, exercise, lifestyle changes, and medicine.   Check your blood glucose every day, as often as told by your health care provider.   Having diabetes can put you at risk for other long-term (chronic) conditions, such as heart disease and kidney disease. Your health care provider may prescribe medicines to help prevent complications from diabetes.   Keep all follow-up visits as told by your health care provider. This is important.  This information is not intended to replace advice given to you by your health care provider. Make sure you discuss any questions you have with your health care provider.  Document Released: 11/25/2015 Document Revised: 01/24/2018 Document Reviewed: 09/06/2015  Elsevier Interactive Patient Education  2019 [employer]

## 2018-12-15 IMAGING — US US SCROTUM W/ DOPPLER COMPLETE
1 series · 13 of 25 positions shown · non-contrast
Comparison: None.

CLINICAL DATA: Right greater than left scrotal swelling, pain

EXAM:
SCROTAL ULTRASOUND
DOPPLER ULTRASOUND OF THE TESTICLES
TECHNIQUE: Complete ultrasound examination of the testicles, epididymis, and
other scrotal structures was performed. Color and spectral Doppler
ultrasound were also utilized to evaluate blood flow to the
testicles.

[Series 1: us scrotum w/ doppler complete · 0.09mm/px · 59 acquisitions, 13 frames shown]
[im 1/59]
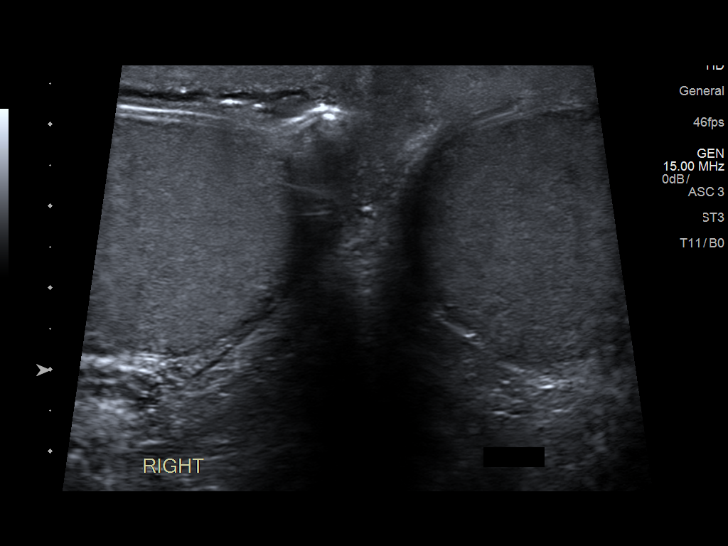
[im 5/59]
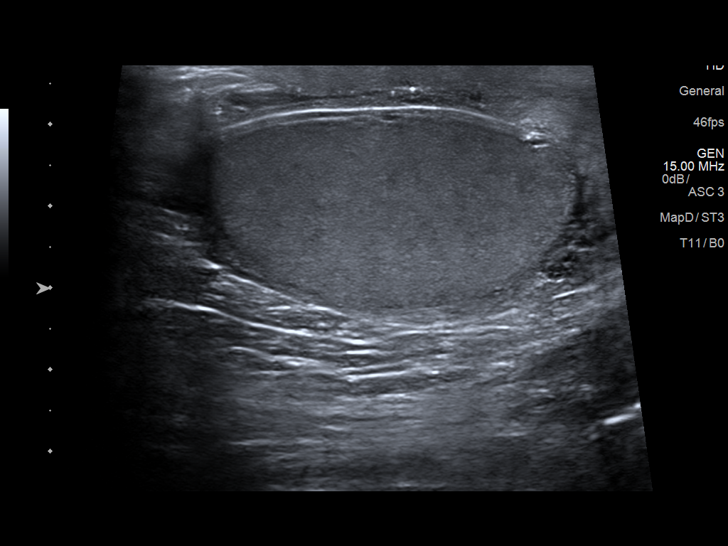
[im 10/59]
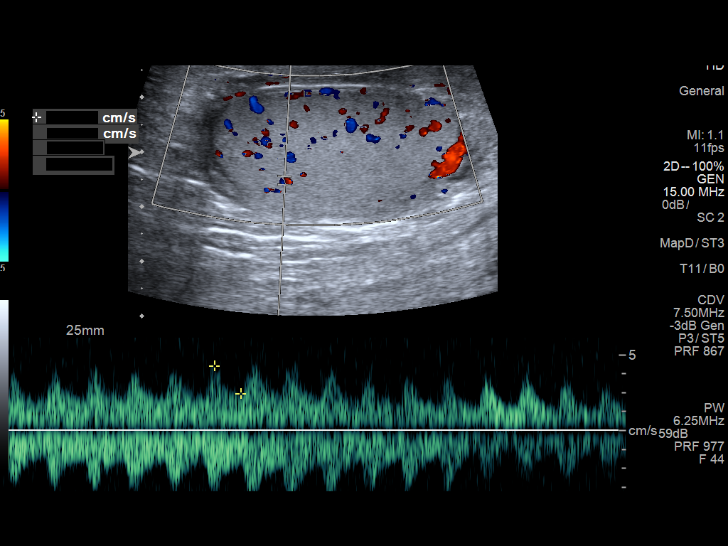
[im 15/59]
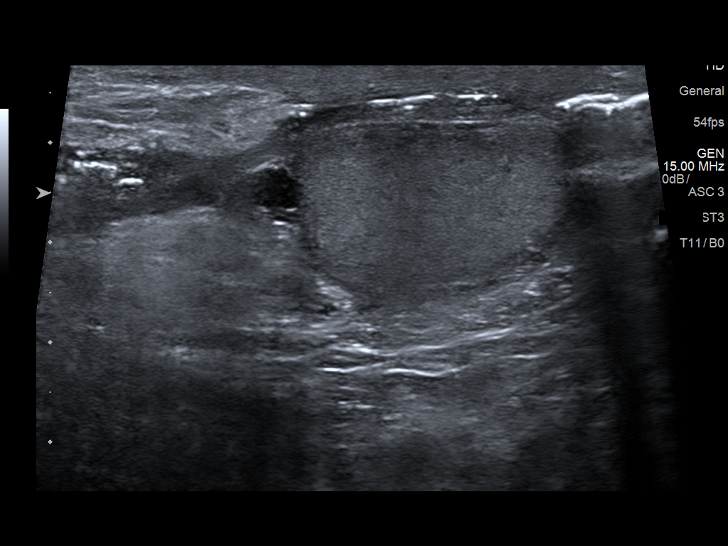
[im 20/59]
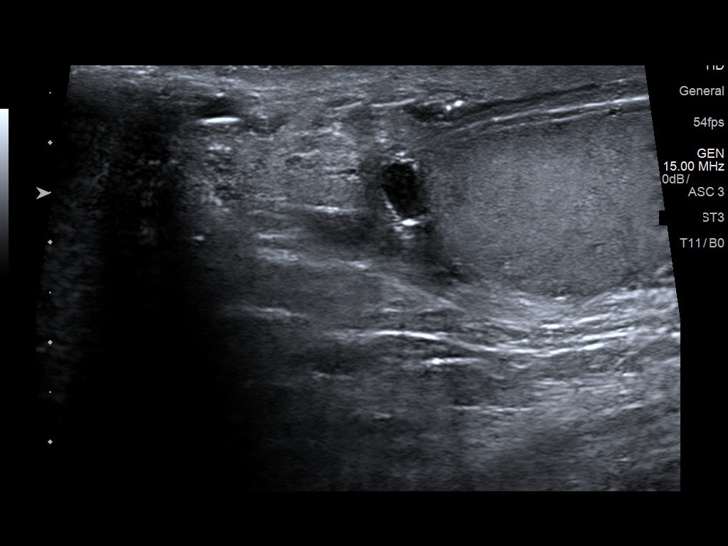
[im 25/59]
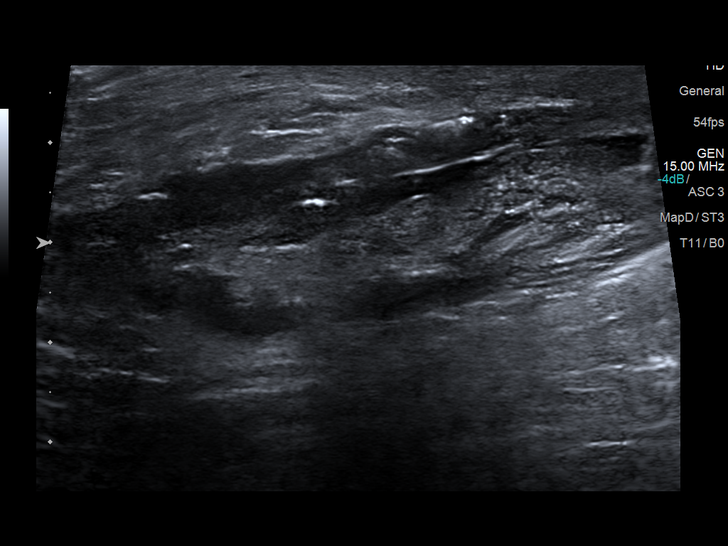
[im 30/59]
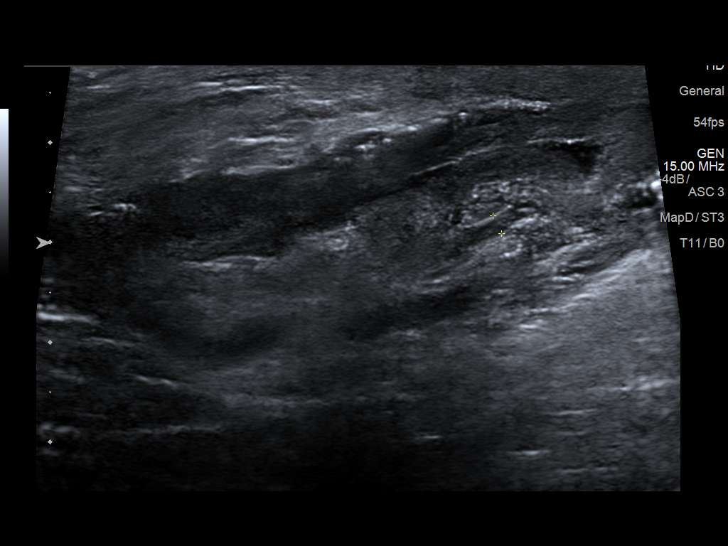
[im 34/59]
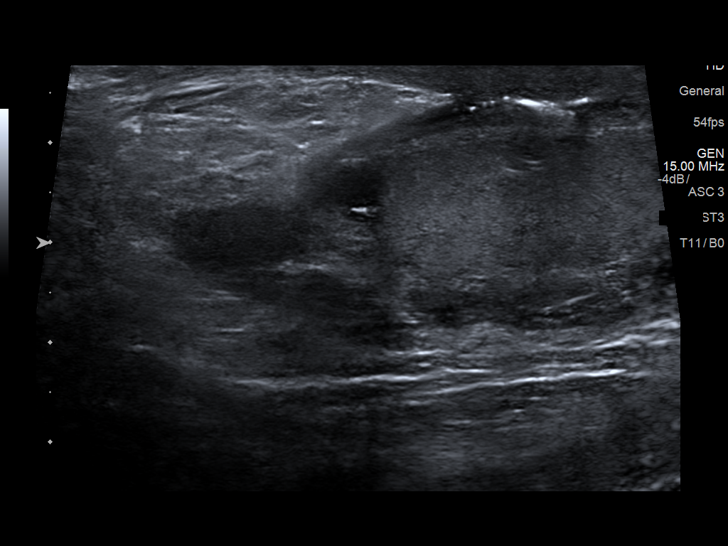
[im 39/59]
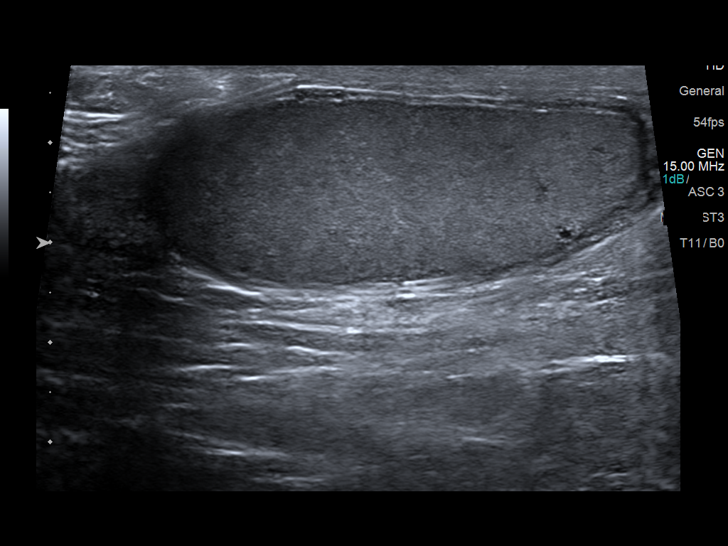
[im 44/59]
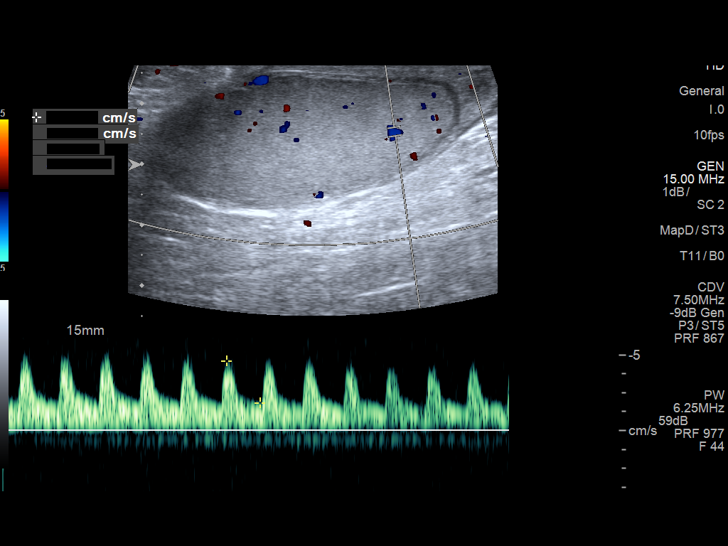
[im 49/59]
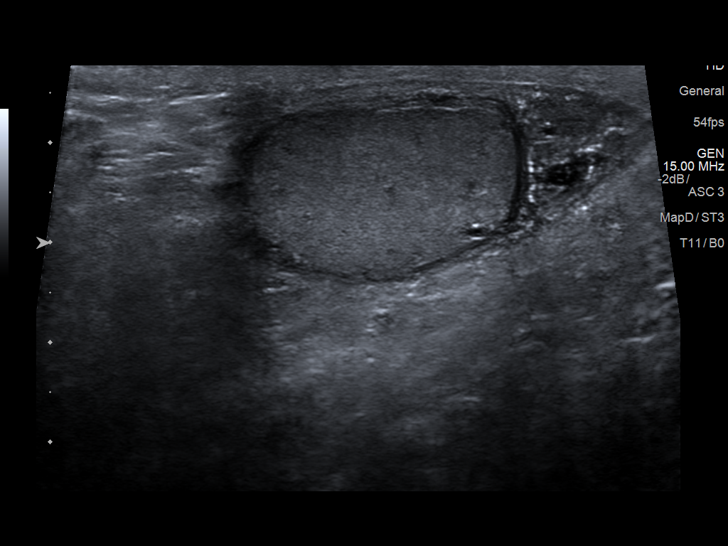
[im 54/59]
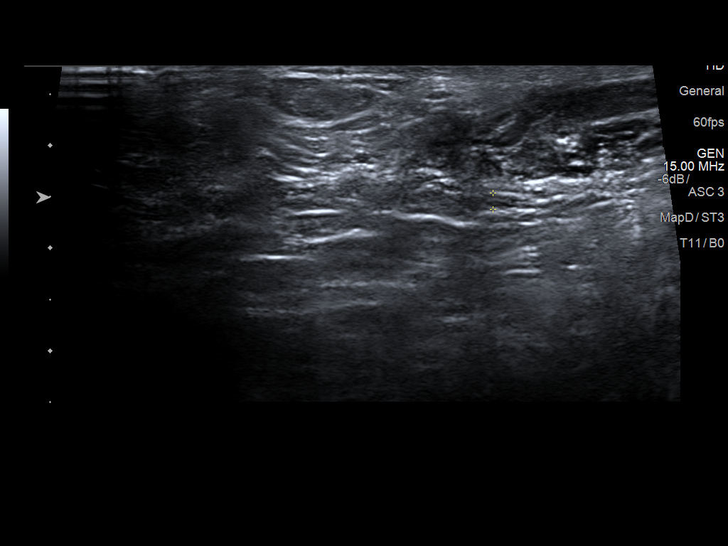
[im 59/59]
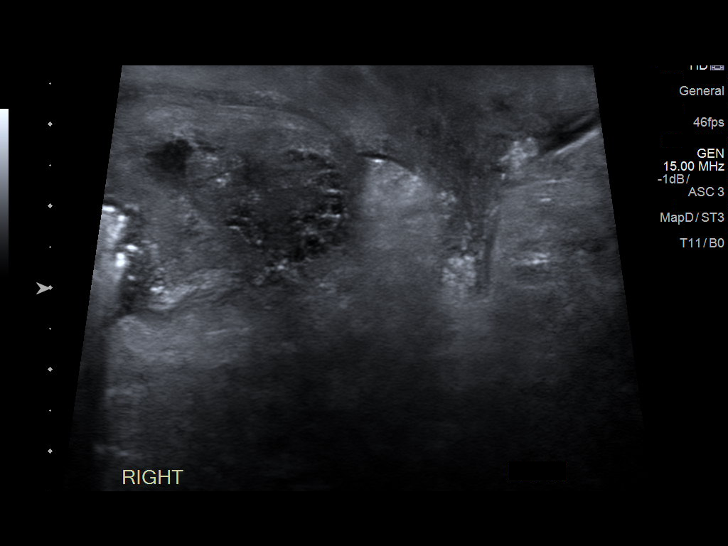

[13 of 25 positions shown; findings below may reference images not displayed]

FINDINGS: Right testicle

Measurements: 4.5 x 2.3 x 3.6 cm. No mass or microlithiasis
visualized.

Left testicle

Measurements: 4.9 x 1.9 x 3.2 cm. No mass or microlithiasis
visualized.

Right epididymis:  5 mm epididymal head cyst.

Left epididymis:  Normal in size and appearance.

Hydrocele:  None visualized.

Varicocele:  None visualized.

Pulsed Doppler interrogation of both testes demonstrates normal low
resistance arterial and venous waveforms bilaterally.

Other: Extensive echogenic foci are noted throughout the right
scrotum with posterior acoustic shadowing. This is concerning for
possible scrotal soft tissue gas. Cannot exclude Jiew
gangrene. Recommend further evaluation with he pelvic CT.
IMPRESSION: Echogenic foci with shadowing noted throughout the right scrotal
soft tissues concerning for gas. Cannot exclude Jiew gangrene.
Recommend further evaluation with pelvic CT.

No testicular abnormality.

These results were called by telephone at the time of interpretation
on 01/27/2018 at [DATE] to ALEXANDRE SPILLANE , who verbally
acknowledged these results.

## 2019-01-13 ENCOUNTER — Other Ambulatory Visit: Payer: Self-pay | Admitting: Family Medicine

## 2019-01-20 ENCOUNTER — Telehealth: Payer: Self-pay | Admitting: Family Medicine

## 2019-01-20 NOTE — Telephone Encounter (Signed)
Copied from [place] [id number]. Topic: Quick Communication - Rx Refill/Question >> Jan 20, 2019 12:44 PM Erick Blinks wrote: Medication: Bansaglar insulin pen   Has the patient contacted their pharmacy? Yes  (Agent: If no, request that the patient contact the pharmacy for the refill.) (Agent: If yes, when and what did the pharmacy advise?)  Preferred Pharmacy (with phone number or street name): [id number] - [place], [address] [place] [address] Phone: [phone] Fax: [phone]    Agent: Please be advised that RX refills may take up to 3 business days. We ask that you follow-up with your pharmacy.

## 2019-01-20 NOTE — Telephone Encounter (Signed)
Called pt left a message to call the office with clarification on his request for Basarglar insulin. 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

## 2019-01-25 ENCOUNTER — Ambulatory Visit (INDEPENDENT_AMBULATORY_CARE_PROVIDER_SITE_OTHER): Payer: 59 | Admitting: Family Medicine

## 2019-01-25 ENCOUNTER — Other Ambulatory Visit: Payer: Self-pay

## 2019-01-25 ENCOUNTER — Encounter: Payer: Self-pay | Admitting: Family Medicine

## 2019-01-25 VITALS — BP 136/96 | HR 90 | Temp 97.5°F | Wt 227.0 lb

## 2019-01-25 DIAGNOSIS — E119 Type 2 diabetes mellitus without complications: Secondary | ICD-10-CM | POA: Diagnosis not present

## 2019-01-25 DIAGNOSIS — I10 Essential (primary) hypertension: Secondary | ICD-10-CM | POA: Diagnosis not present

## 2019-01-25 DIAGNOSIS — Z794 Long term (current) use of insulin: Secondary | ICD-10-CM | POA: Diagnosis not present

## 2019-01-25 DIAGNOSIS — Z7189 Other specified counseling: Secondary | ICD-10-CM

## 2019-01-25 MED ORDER — BASAGLAR KWIKPEN 100 UNIT/ML ~~LOC~~ SOPN: PEN_INJECTOR | SUBCUTANEOUS | 5 refills | Status: DC

## 2019-01-25 MED ORDER — LISINOPRIL 10 MG PO TABS: 10.0000 mg | ORAL_TABLET | Freq: Every day | ORAL | 3 refills | Status: DC

## 2019-01-25 NOTE — Progress Notes (Signed)
Subjective:    Patient ID: Erik Rojas, male    DOB: 1971/04/29, 48 y.o.   MRN: [id number]  No chief complaint on file.   HPI Patient was seen today for f/u.    DM II:  Pt using freestyle libre continuous meter.  Taking basaglar 25 units qhs and humalog 3 units TID.  Pt mindful of eating healthy/watching his carb intake.  Pt denies hypo or hyperglycemia.  Pt's A1C per the freestyle libre is 5.3%.  Pt states his insurance company will no longer cover basaglar next month.  HTN:  Has not been checking bp.  Taking Norvasc 5 mg.  States had rx for lisinopril 10 mg, but stopped taking it 2/2 feeling dizzy.  Pt was able to restart lisinopril, but has been out of it.    Pt has questions regarding travel safety and COVID-19.  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  Pt is hesitant given his health hx.  Pt is also concerned he may be able to get to [place], but be stuck in quarantine in another country.  Past Medical History:  Diagnosis Date  . HTN (hypertension)   . Scrotal abscess 01/28/2018   right  . Type II diabetes mellitus ([place]) 01/27/2018   "just dx'd" (01/28/2018)    No Known Allergies  ROS General: Denies fever, chills, night sweats, changes in weight, changes in appetite HEENT: Denies headaches, ear pain, changes in vision, rhinorrhea, sore throat CV: Denies CP, palpitations, SOB, orthopnea Pulm: Denies SOB, cough, wheezing GI: Denies abdominal pain, nausea, vomiting, diarrhea, constipation GU: Denies dysuria, hematuria, frequency, vaginal discharge Msk: Denies muscle cramps, joint pains Neuro: Denies weakness, numbness, tingling Skin: Denies rashes, bruising Psych: Denies depression, anxiety, hallucinations      Objective:    Blood pressure (!) 136/96, pulse 90, temperature (!) 97.5 F (36.4 C), temperature source Oral, weight 227 lb (103 kg), SpO2 97 %.   Gen. Pleasant,  well-nourished, in no distress, normal affect   HEENT: /AT, face symmetric, conjunctiva clear, no scleral icterus, PERRLA, nares patent without drainage Lungs: no accessory muscle use, CTAB, no wheezes or rales Cardiovascular: RRR, no m/r/g, no peripheral edema. Musculoskeletal: No deformities, no cyanosis or clubbing, normal tone Neuro:  A&Ox3, CN II-XII intact, normal gait Skin:  Warm, no lesions/ rash   Wt Readings from Last 3 Encounters:  01/25/19 227 lb (103 kg)  08/03/18 223 lb (101.2 kg)  05/12/18 216 lb (98 kg)    Lab Results  Component Value Date   WBC 13.8 (H) 05/14/2018   HGB 14.5 05/14/2018   HCT 42.8 05/14/2018   PLT 554 (H) 05/14/2018   GLUCOSE 156 (H) 05/13/2018   ALT 15 05/08/2018   AST 18 05/08/2018   NA 139 05/13/2018   K 4.1 05/13/2018   CL 105 05/13/2018   CREATININE 0.96 05/13/2018   BUN 20 05/13/2018   CO2 25 05/13/2018   HGBA1C 5.2 05/09/2018    Assessment/Plan:  Essential hypertension -continue norvasc 5 mg  -will refill lisinopril 10 mg -encouraged to check bp at home -continue lifestyle modifications -f/u in 1-2 months for bp  Controlled type 2 diabetes mellitus without complication, with long-term current use of insulin (HCC)  -fsbs averages an A1C of 5.3% per pt's freestyle libre continuous monitor -continue basaglar 25 units qhs and humalog 3 units TID. -continue lifestyle modifications - Basaglar refilled.  If needed will switch med to lantus based  on insurance company preference.  Educated About Covid-19 Virus Infection  -questions answered -pt will wait a few months, then reassess the need to travel.  F/u prn in 2-3 months, sooner for BP elevation  Grier Mitts, MD

## 2019-01-25 NOTE — Patient Instructions (Signed)
Diabetes Mellitus and Standards of Medical Care Managing diabetes (diabetes mellitus) can be complicated. Your diabetes treatment may be managed by a team of health care providers, including:  A physician who specializes in diabetes (endocrinologist).  A nurse practitioner or physician assistant.  Nurses.  A diet and nutrition specialist (registered dietitian).  A certified diabetes educator (CDE).  An exercise specialist.  A pharmacist.  An eye doctor.  A foot specialist (podiatrist).  A dentist.  A primary care provider.  A mental health provider. Your health care providers follow guidelines to help you get the best quality of care. The following schedule is a general guideline for your diabetes management plan. Your health care providers may give you more specific instructions. Physical exams Upon being diagnosed with diabetes mellitus, and each year after that, your health care provider will ask about your medical and family history. He or she will also do a physical exam. Your exam may include:  Measuring your height, weight, and body mass index (BMI).  Checking your blood pressure. This will be done at every routine medical visit. Your target blood pressure may vary depending on your medical conditions, your age, and other factors.  Thyroid gland exam.  Skin exam.  Screening for damage to your nerves (peripheral neuropathy). This may include checking the pulse in your legs and feet and checking the level of sensation in your hands and feet.  A complete foot exam to inspect the structure and skin of your feet, including checking for cuts, bruises, redness, blisters, sores, or other problems.  Screening for blood vessel (vascular) problems, which may include checking the pulse in your legs and feet and checking your temperature. Blood tests Depending on your treatment plan and your personal needs, you may have the following tests done:  HbA1c (hemoglobin A1c). This  test provides information about blood sugar (glucose) control over the previous 2-3 months. It is used to adjust your treatment plan, if needed. This test will be done: ? At least 2 times a year, if you are meeting your treatment goals. ? 4 times a year, if you are not meeting your treatment goals or if treatment goals have changed.  Lipid testing, including total, LDL, and HDL cholesterol and triglyceride levels. ? The goal for LDL is less than 100 mg/dL (5.5 mmol/L). If you are at high risk for complications, the goal is less than 70 mg/dL (3.9 mmol/L). ? The goal for HDL is 40 mg/dL (2.2 mmol/L) or higher for men and 50 mg/dL (2.8 mmol/L) or higher for women. An HDL cholesterol of 60 mg/dL (3.3 mmol/L) or higher gives some protection against heart disease. ? The goal for triglycerides is less than 150 mg/dL (8.3 mmol/L).  Liver function tests.  Kidney function tests.  Thyroid function tests. Dental and eye exams  Visit your dentist two times a year.  If you have type 1 diabetes, your health care provider may recommend an eye exam 3-5 years after you are diagnosed, and then once a year after your first exam. ? For children with type 1 diabetes, a health care provider may recommend an eye exam when your child is age 10 or older and has had diabetes for 3-5 years. After the first exam, your child should get an eye exam once a year.  If you have type 2 diabetes, your health care provider may recommend an eye exam as soon as you are diagnosed, and then once a year after your first exam. Immunizations   The  yearly flu (influenza) vaccine is recommended for everyone 6 months or older who has diabetes.  The pneumonia (pneumococcal) vaccine is recommended for everyone 2 years or older who has diabetes. If you are 87 or older, you may get the pneumonia vaccine as a series of two separate shots.  The hepatitis B vaccine is recommended for adults shortly after being diagnosed with diabetes.   Adults and children with diabetes should receive all other vaccines according to age-specific recommendations from the Centers for Disease Control and Prevention (CDC). Mental and emotional health Screening for symptoms of eating disorders, anxiety, and depression is recommended at the time of diagnosis and afterward as needed. If your screening shows that you have symptoms (positive screening result), you may need more evaluation and you may work with a mental health care provider. Treatment plan Your treatment plan will be reviewed at every medical visit. You and your health care provider will discuss:  How you are taking your medicines, including insulin.  Any side effects you are experiencing.  Your blood glucose target goals.  The frequency of your blood glucose monitoring.  Lifestyle habits, such as activity level as well as tobacco, alcohol, and substance use. Diabetes self-management education Your health care provider will assess how well you are monitoring your blood glucose levels and whether you are taking your insulin correctly. He or she may refer you to:  A certified diabetes educator to manage your diabetes throughout your life, starting at diagnosis.  A registered dietitian who can create or review your personal nutrition plan.  An exercise specialist who can discuss your activity level and exercise plan. Summary  Managing diabetes (diabetes mellitus) can be complicated. Your diabetes treatment may be managed by a team of health care providers.  Your health care providers follow guidelines in order to help you get the best quality of care.  Standards of care including having regular physical exams, blood tests, blood pressure monitoring, immunizations, screening tests, and education about how to manage your diabetes.  Your health care providers may also give you more specific instructions based on your individual health. This information is not intended to replace  advice given to you by your health care provider. Make sure you discuss any questions you have with your health care provider. Document Released: 05/31/2009 Document Revised: 04/22/2018 Document Reviewed: 05/01/2016 Elsevier Interactive Patient Education  2019 [employer]  Preventing Diabetes Mellitus Complications You can take action to prevent or slow down problems that are caused by diabetes (diabetes mellitus). Following your diabetes plan and taking care of yourself can reduce your risk of serious or life-threatening complications. What actions can I take to prevent diabetes complications? Manage your diabetes   Follow instructions from your health care providers about managing your diabetes. Your diabetes may be managed by a team of health care providers who can teach you how to care for yourself and can answer questions that you have.  Educate yourself about your condition so you can make healthy choices about eating and physical activity.  Check your blood sugar (glucose) levels as often as directed. Your health care provider will help you decide how often to check your blood glucose level depending on your treatment goals and how well you are meeting them.  Ask your health care provider if you should take low-dose aspirin daily and what dose is recommended for you. Taking low-dose aspirin daily is recommended to help prevent cardiovascular disease. Do not use nicotine or tobacco Do not use any products that  contain nicotine or tobacco, such as cigarettes and e-cigarettes. If you need help quitting, ask your health care provider. Nicotine raises your risk for diabetes problems. If you quit using nicotine:  You will lower your risk for heart attack, stroke, nerve disease, and kidney disease.  Your cholesterol and blood pressure may improve.  Your blood circulation will improve. Keep your blood pressure under control Your personal target blood pressure is determined based on:   Your age.  Your medicines.  How long you have had diabetes.  Any other medical conditions you have. To control your blood pressure:  Follow instructions from your health care provider about meal planning, exercise, and medicines.  Make sure your health care provider checks your blood pressure at every medical visit.  Monitor your blood pressure at home as told by your health care provider.  Keep your cholesterol under control To control your cholesterol:  Follow instructions from your health care provider about meal planning, exercise, and medicines.  Have your cholesterol checked at least once a year.  You may be prescribed medicine to lower cholesterol (statin). If you are not taking a statin, ask your health care provider if you should be. Controlling your cholesterol may:  Help prevent heart disease and stroke. These are the most common health problems for people with diabetes.  Improve your blood flow. Schedule and keep yearly physical exams and eye exams Your health care provider will tell you how often you need medical visits depending on your diabetes management plan. Keep all follow-up visits as directed. This is important so possible problems can be identified early and complications can be avoided or treated.  Every visit with your health care provider should include measuring your: ? Weight. ? Blood pressure. ? Blood glucose control.  Your A1c (hemoglobin A1c) level should be checked: ? At least 2 times a year, if you are meeting your treatment goals. ? 4 times a year, if you are not meeting treatment goals or if your treatment goals have changed.  Your blood lipids (lipid profile) should be checked yearly. You should also be checked yearly for protein in your urine (urine microalbumin).  If you have type 1 diabetes, get an eye exam 3-5 years after you are diagnosed, and then once a year after your first exam.  If you have type 2 diabetes, get an eye exam as  soon as you are diagnosed, and then once a year after your first exam. Keep your vaccines current It is recommended that you receive:  A flu (influenza) vaccine every year.  A pneumonia (pneumococcal) vaccine and a hepatitis B vaccine. If you are age 54 or older, you may get the pneumonia vaccine as a series of two separate shots. Ask your health care provider which other vaccines may be recommended. Take care of your feet Diabetes may cause you to have poor blood circulation to your legs and feet. Because of this, taking care of your feet is very important. Diabetes can cause:  The skin on the feet to get thinner, break more easily, and heal more slowly.  Nerve damage in your legs and feet, which results in decreased feeling. You may not notice minor injuries that could lead to serious problems. To avoid foot problems:  Check your skin and feet every day for cuts, bruises, redness, blisters, or sores.  Schedule a foot exam with your health care provider once every year. This exam includes: ? Inspecting of the structure and skin of your feet. ? Checking  the pulses and sensation in your feet.  Make sure that your health care provider performs a visual foot exam at every medical visit.  Take care of your teeth People with poorly controlled diabetes are more likely to have gum (periodontal) disease. Diabetes can make periodontal diseases harder to control. If not treated, periodontal diseases can lead to tooth loss. To prevent this:  Brush your teeth twice a day.  Floss at least once a day.  Visit your dentist 2 times a year. Drink responsibly Limit alcohol intake to no more than 1 drink a day for nonpregnant women and 2 drinks a day for men. One drink equals 12 oz of beer, 5 oz of wine, or 1 oz of hard liquor.  It is important to eat food when you drink alcohol to avoid low blood glucose (hypoglycemia). Avoid alcohol if you:  Have a history of alcohol abuse or dependence.  Are  pregnant.  Have liver disease, pancreatitis, advanced neuropathy, or severe hypertriglyceridemia. Lessen stress Living with diabetes can be stressful. When you are experiencing stress, your blood glucose may be affected in two ways:  Stress hormones may cause your blood glucose to rise.  You may be distracted from taking good care of yourself. Be aware of your stress level and make changes to help you manage challenging situations. To lower your stress levels:  Consider joining a support group.  Do planned relaxation or meditation.  Do a hobby that you enjoy.  Maintain healthy relationships.  Exercise regularly.  Work with your health care provider or a mental health professional. Summary  You can take action to prevent or slow down problems that are caused by diabetes (diabetes mellitus). Following your diabetes plan and taking care of yourself can reduce your risk of serious or life-threatening complications.  Follow instructions from your health care providers about managing your diabetes. Your diabetes may be managed by a team of health care providers who can teach you how to care for yourself and can answer questions that you have.  Your health care provider will tell you how often you need medical visits depending on your diabetes management plan. Keep all follow-up visits as directed. This is important so possible problems can be identified early and complications can be avoided or treated. This information is not intended to replace advice given to you by your health care provider. Make sure you discuss any questions you have with your health care provider. Document Released: 04/21/2011 Document Revised: 03/23/2017 Document Reviewed: 05/02/2016 Elsevier Interactive Patient Education  2019 [employer].  Managing Your Hypertension Hypertension is commonly called high blood pressure. This is when the force of your blood pressing against the walls of your arteries is too  strong. Arteries are blood vessels that carry blood from your heart throughout your body. Hypertension forces the heart to work harder to pump blood, and may cause the arteries to become narrow or stiff. Having untreated or uncontrolled hypertension can cause heart attack, stroke, kidney disease, and other problems. What are blood pressure readings? A blood pressure reading consists of a higher number over a lower number. Ideally, your blood pressure should be below 120/80. The first ("top") number is called the systolic pressure. It is a measure of the pressure in your arteries as your heart beats. The second ("bottom") number is called the diastolic pressure. It is a measure of the pressure in your arteries as the heart relaxes. What does my blood pressure reading mean? Blood pressure is classified into four  stages. Based on your blood pressure reading, your health care provider may use the following stages to determine what type of treatment you need, if any. Systolic pressure and diastolic pressure are measured in a unit called mm Hg. Normal  Systolic pressure: below 825.  Diastolic pressure: below 80. Elevated  Systolic pressure: 053-976.  Diastolic pressure: below 80. Hypertension stage 1  Systolic pressure: 734-193.  Diastolic pressure: 79-02. Hypertension stage 2  Systolic pressure: 409 or above.  Diastolic pressure: 90 or above. What health risks are associated with hypertension? Managing your hypertension is an important responsibility. Uncontrolled hypertension can lead to:  A heart attack.  A stroke.  A weakened blood vessel (aneurysm).  Heart failure.  Kidney damage.  Eye damage.  Metabolic syndrome.  Memory and concentration problems. What changes can I make to manage my hypertension? Hypertension can be managed by making lifestyle changes and possibly by taking medicines. Your health care provider will help you make a plan to bring your blood pressure within  a normal range. Eating and drinking   Eat a diet that is high in fiber and potassium, and low in salt (sodium), added sugar, and fat. An example eating plan is called the DASH (Dietary Approaches to Stop Hypertension) diet. To eat this way: ? Eat plenty of fresh fruits and vegetables. Try to fill half of your plate at each meal with fruits and vegetables. ? Eat whole grains, such as whole wheat pasta, brown rice, or whole grain bread. Fill about one quarter of your plate with whole grains. ? Eat low-fat diary products. ? Avoid fatty cuts of meat, processed or cured meats, and poultry with skin. Fill about one quarter of your plate with lean proteins such as fish, chicken without skin, beans, eggs, and tofu. ? Avoid premade and processed foods. These tend to be higher in sodium, added sugar, and fat.  Reduce your daily sodium intake. Most people with hypertension should eat less than 1,500 mg of sodium a day.  Limit alcohol intake to no more than 1 drink a day for nonpregnant women and 2 drinks a day for men. One drink equals 12 oz of beer, 5 oz of wine, or 1 oz of hard liquor. Lifestyle  Work with your health care provider to maintain a healthy body weight, or to lose weight. Ask what an ideal weight is for you.  Get at least 30 minutes of exercise that causes your heart to beat faster (aerobic exercise) most days of the week. Activities may include walking, swimming, or biking.  Include exercise to strengthen your muscles (resistance exercise), such as weight lifting, as part of your weekly exercise routine. Try to do these types of exercises for 30 minutes at least 3 days a week.  Do not use any products that contain nicotine or tobacco, such as cigarettes and e-cigarettes. If you need help quitting, ask your health care provider.  Control any long-term (chronic) conditions you have, such as high cholesterol or diabetes. Monitoring  Monitor your blood pressure at home as told by your  health care provider. Your personal target blood pressure may vary depending on your medical conditions, your age, and other factors.  Have your blood pressure checked regularly, as often as told by your health care provider. Working with your health care provider  Review all the medicines you take with your health care provider because there may be side effects or interactions.  Talk with your health care provider about your diet, exercise habits, and  other lifestyle factors that may be contributing to hypertension.  Visit your health care provider regularly. Your health care provider can help you create and adjust your plan for managing hypertension. Will I need medicine to control my blood pressure? Your health care provider may prescribe medicine if lifestyle changes are not enough to get your blood pressure under control, and if:  Your systolic blood pressure is 130 or higher.  Your diastolic blood pressure is 80 or higher. Take medicines only as told by your health care provider. Follow the directions carefully. Blood pressure medicines must be taken as prescribed. The medicine does not work as well when you skip doses. Skipping doses also puts you at risk for problems. Contact a health care provider if:  You think you are having a reaction to medicines you have taken.  You have repeated (recurrent) headaches.  You feel dizzy.  You have swelling in your ankles.  You have trouble with your vision. Get help right away if:  You develop a severe headache or confusion.  You have unusual weakness or numbness, or you feel faint.  You have severe pain in your chest or abdomen.  You vomit repeatedly.  You have trouble breathing. Summary  Hypertension is when the force of blood pumping through your arteries is too strong. If this condition is not controlled, it may put you at risk for serious complications.  Your personal target blood pressure may vary depending on your medical  conditions, your age, and other factors. For most people, a normal blood pressure is less than 120/80.  Hypertension is managed by lifestyle changes, medicines, or both. Lifestyle changes include weight loss, eating a healthy, low-sodium diet, exercising more, and limiting alcohol. This information is not intended to replace advice given to you by your health care provider. Make sure you discuss any questions you have with your health care provider. Document Released: 04/27/2012 Document Revised: 07/01/2016 Document Reviewed: 07/01/2016 Elsevier Interactive Patient Education  2019 [employer].

## 2019-03-16 ENCOUNTER — Other Ambulatory Visit: Payer: Self-pay | Admitting: Family Medicine

## 2019-03-16 DIAGNOSIS — E119 Type 2 diabetes mellitus without complications: Secondary | ICD-10-CM

## 2019-05-06 ENCOUNTER — Other Ambulatory Visit: Payer: Self-pay

## 2019-05-06 ENCOUNTER — Other Ambulatory Visit: Payer: Self-pay | Admitting: Family Medicine

## 2019-05-06 DIAGNOSIS — E119 Type 2 diabetes mellitus without complications: Secondary | ICD-10-CM

## 2019-05-06 MED ORDER — BASAGLAR KWIKPEN 100 UNIT/ML ~~LOC~~ SOPN: PEN_INJECTOR | SUBCUTANEOUS | 5 refills | Status: DC

## 2019-05-07 ENCOUNTER — Other Ambulatory Visit: Payer: Self-pay | Admitting: Family Medicine

## 2019-05-08 ENCOUNTER — Other Ambulatory Visit: Payer: Self-pay

## 2019-05-08 MED ORDER — LANTUS SOLOSTAR 100 UNIT/ML ~~LOC~~ SOPN: PEN_INJECTOR | SUBCUTANEOUS | 1 refills | Status: DC

## 2019-05-29 ENCOUNTER — Ambulatory Visit: Payer: 59 | Admitting: Family Medicine

## 2019-06-02 ENCOUNTER — Other Ambulatory Visit: Payer: Self-pay | Admitting: Family Medicine

## 2019-06-22 ENCOUNTER — Ambulatory Visit: Payer: 59 | Admitting: Family Medicine

## 2019-07-03 ENCOUNTER — Other Ambulatory Visit: Payer: Self-pay | Admitting: Family Medicine

## 2019-08-03 ENCOUNTER — Ambulatory Visit: Payer: 59 | Admitting: Family Medicine

## 2019-08-03 ENCOUNTER — Other Ambulatory Visit: Payer: Self-pay

## 2019-08-03 ENCOUNTER — Telehealth: Payer: Self-pay | Admitting: Family Medicine

## 2019-08-03 MED ORDER — LANTUS SOLOSTAR 100 UNIT/ML ~~LOC~~ SOPN: PEN_INJECTOR | SUBCUTANEOUS | 1 refills | Status: DC

## 2019-08-03 MED ORDER — INSULIN LISPRO 100 UNIT/ML ~~LOC~~ SOLN: 3.0000 [IU] | Freq: Three times a day (TID) | SUBCUTANEOUS | 2 refills | Status: DC

## 2019-08-03 NOTE — Telephone Encounter (Signed)
Pt insulin sent to pt pharmacy

## 2019-08-03 NOTE — Telephone Encounter (Signed)
Patient did show up for his appointment but his appt got cancelled, however he stated that he didn't not cancel the appointment. He is running low on Insulin.  He needs a refill on Humalong and Lantus called in to his pharmacy.  Patient r/s appointment but he can't get in until 09/07/19.  Pharmacy:  [facility] [address]

## 2019-09-06 ENCOUNTER — Other Ambulatory Visit: Payer: Self-pay

## 2019-09-07 ENCOUNTER — Ambulatory Visit: Payer: 59 | Admitting: Family Medicine

## 2019-09-27 ENCOUNTER — Other Ambulatory Visit: Payer: Self-pay | Admitting: Family Medicine

## 2019-10-01 ENCOUNTER — Ambulatory Visit: Payer: 59

## 2019-10-09 ENCOUNTER — Telehealth: Payer: Self-pay | Admitting: Family Medicine

## 2019-10-09 ENCOUNTER — Other Ambulatory Visit: Payer: Self-pay

## 2019-10-09 MED ORDER — INSULIN LISPRO 100 UNIT/ML ~~LOC~~ SOLN: 3.0000 [IU] | Freq: Three times a day (TID) | SUBCUTANEOUS | 2 refills | Status: DC

## 2019-10-09 NOTE — Telephone Encounter (Signed)
Rx sent to pt pharmacy as requested 

## 2019-10-09 NOTE — Telephone Encounter (Signed)
Pt is needing a refill on the following Rx's insulin lispro (HUMALOG) 100 ML (he states that he would like to have the pen).   Pharm:  [facility] [address]

## 2019-10-22 ENCOUNTER — Other Ambulatory Visit: Payer: Self-pay | Admitting: Family Medicine

## 2019-11-07 ENCOUNTER — Other Ambulatory Visit: Payer: Self-pay

## 2019-11-08 ENCOUNTER — Encounter: Payer: Self-pay | Admitting: Family Medicine

## 2019-11-08 ENCOUNTER — Ambulatory Visit (INDEPENDENT_AMBULATORY_CARE_PROVIDER_SITE_OTHER): Payer: 59 | Admitting: Family Medicine

## 2019-11-08 VITALS — BP 130/82 | HR 107 | Temp 97.8°F | Wt 243.0 lb

## 2019-11-08 DIAGNOSIS — I10 Essential (primary) hypertension: Secondary | ICD-10-CM

## 2019-11-08 DIAGNOSIS — E119 Type 2 diabetes mellitus without complications: Secondary | ICD-10-CM

## 2019-11-08 DIAGNOSIS — Z794 Long term (current) use of insulin: Secondary | ICD-10-CM | POA: Diagnosis not present

## 2019-11-08 NOTE — Progress Notes (Signed)
Subjective:    Patient ID: Erik Rojas, male    DOB: Aug 08, 1971, 49 y.o.   MRN: [id number]  No chief complaint on file.   HPI Patient was seen today for f/u. Pt doing well. BS have been controlled.  Using freestyle libre sensor, taking humalog 3 units before meals.  Pt's insurance company checked his A1C which was 5.9%.     Pt notes work-related stress this week.  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    Past Medical History:  Diagnosis Date  . HTN (hypertension)   . Scrotal abscess 01/28/2018   right  . Type II diabetes mellitus ([place]) 01/27/2018   "just dx'd" (01/28/2018)    No Known Allergies  ROS General: Denies fever, chills, night sweats, changes in weight, changes in appetite HEENT: Denies headaches, ear pain, changes in vision, rhinorrhea, sore throat CV: Denies CP, palpitations, SOB, orthopnea Pulm: Denies SOB, cough, wheezing GI: Denies abdominal pain, nausea, vomiting, diarrhea, constipation GU: Denies dysuria, hematuria, frequency, vaginal discharge Msk: Denies muscle cramps, joint pains Neuro: Denies weakness, numbness, tingling Skin: Denies rashes, bruising Psych: Denies depression, anxiety, hallucinations    Objective:    Blood pressure 130/82, pulse (!) 107, temperature 97.8 F (36.6 C), temperature source Temporal, weight 243 lb (110.2 kg), SpO2 97 %.   Gen. Pleasant, well-nourished, in no distress, normal affect  HEENT: [place]/AT, face symmetric, conjunctiva no scleral icterus, PERRLA, EOMI, nares patent without drainage Lungs: no accessory muscle use, CTAB, no wheezes or rales Cardiovascular: RRR, no m/r/g, no peripheral edema Neuro:  A&Ox3, CN II-XII intact, normal gait Skin:  Warm, no lesions/ rash   Wt Readings from Last 3 Encounters:  11/08/19 243 lb (110.2 kg)  01/25/19 227 lb (103 kg)  08/03/18 223 lb (101.2 kg)    Lab Results  Component Value Date   WBC 13.8 (H) 05/14/2018   HGB 14.5 05/14/2018   HCT 42.8 05/14/2018   PLT 554 (H) 05/14/2018   GLUCOSE 156 (H) 05/13/2018   ALT 15 05/08/2018   AST 18 05/08/2018   NA 139 05/13/2018   K 4.1 05/13/2018   CL 105 05/13/2018   CREATININE 0.96 05/13/2018   BUN 20 05/13/2018   CO2 25 05/13/2018   HGBA1C 5.2 05/09/2018    Assessment/Plan:  Controlled type 2 diabetes mellitus without complication, with long-term current use of insulin (HCC) -Hemoglobin A1c 5.9% when done by insurance company few weeks ago -Continue lifestyle modification. -Continue Humalog 3 units 3 times daily with meals -Consider decreasing insulin amount. -We will do foot exam in a few months at CPE.  Essential hypertension -Controlled -Continue Norvasc 5 mg and lisinopril 10 mg -Discussed ways to decrease stress -Continue lifestyle modifications  F/u in the next few months for CPE and labs.  Grier Mitts, MD

## 2019-11-19 ENCOUNTER — Other Ambulatory Visit: Payer: Self-pay | Admitting: Family Medicine

## 2019-12-04 ENCOUNTER — Other Ambulatory Visit: Payer: Self-pay | Admitting: Family Medicine

## 2020-01-20 ENCOUNTER — Other Ambulatory Visit: Payer: Self-pay | Admitting: Family Medicine

## 2020-01-22 NOTE — Telephone Encounter (Signed)
Left a message for pt to call the office to update if he is taking the Humalog or Lantus

## 2020-01-23 NOTE — Telephone Encounter (Signed)
The patient called today to let Izora Gala know that he is taking Humalog 3 x day and Lantus before bed

## 2020-01-24 ENCOUNTER — Ambulatory Visit: Payer: 59 | Admitting: Family Medicine

## 2020-01-24 ENCOUNTER — Encounter: Payer: 59 | Admitting: Family Medicine

## 2020-03-04 ENCOUNTER — Other Ambulatory Visit: Payer: Self-pay | Admitting: Family Medicine

## 2020-03-04 DIAGNOSIS — Z794 Long term (current) use of insulin: Secondary | ICD-10-CM

## 2020-03-04 DIAGNOSIS — E119 Type 2 diabetes mellitus without complications: Secondary | ICD-10-CM

## 2020-03-13 ENCOUNTER — Encounter: Payer: 59 | Admitting: Family Medicine

## 2020-04-14 ENCOUNTER — Other Ambulatory Visit: Payer: Self-pay | Admitting: Family Medicine

## 2020-04-30 ENCOUNTER — Other Ambulatory Visit: Payer: Self-pay

## 2020-05-01 ENCOUNTER — Telehealth: Payer: Self-pay | Admitting: Family Medicine

## 2020-05-01 ENCOUNTER — Ambulatory Visit (INDEPENDENT_AMBULATORY_CARE_PROVIDER_SITE_OTHER): Payer: 59 | Admitting: Family Medicine

## 2020-05-01 ENCOUNTER — Encounter: Payer: Self-pay | Admitting: Family Medicine

## 2020-05-01 VITALS — BP 142/90 | HR 102 | Temp 97.9°F | Ht 65.0 in | Wt 243.0 lb

## 2020-05-01 DIAGNOSIS — E119 Type 2 diabetes mellitus without complications: Secondary | ICD-10-CM

## 2020-05-01 DIAGNOSIS — Z0000 Encounter for general adult medical examination without abnormal findings: Secondary | ICD-10-CM | POA: Diagnosis not present

## 2020-05-01 DIAGNOSIS — I10 Essential (primary) hypertension: Secondary | ICD-10-CM | POA: Diagnosis not present

## 2020-05-01 DIAGNOSIS — Z794 Long term (current) use of insulin: Secondary | ICD-10-CM | POA: Diagnosis not present

## 2020-05-01 NOTE — Patient Instructions (Addendum)
Preventive Care 41-47 Years Old, Male Preventive care refers to lifestyle choices and visits with your health care provider that can promote health and wellness. This includes:  A yearly physical exam. This is also called an annual well check.  Regular dental and eye exams.  Immunizations.  Screening for certain conditions.  Healthy lifestyle choices, such as eating a healthy diet, getting regular exercise, not using drugs or products that contain nicotine and tobacco, and limiting alcohol use. What can I expect for my preventive care visit? Physical exam Your health care provider will check:  Height and weight. These may be used to calculate body mass index (BMI), which is a measurement that tells if you are at a healthy weight.  Heart rate and blood pressure.  Your skin for abnormal spots. Counseling Your health care provider may ask you questions about:  Alcohol, tobacco, and drug use.  Emotional well-being.  Home and relationship well-being.  Sexual activity.  Eating habits.  Work and work Statistician. What immunizations do I need?  Influenza (flu) vaccine  This is recommended every year. Tetanus, diphtheria, and pertussis (Tdap) vaccine  You may need a Td booster every 10 years. Varicella (chickenpox) vaccine  You may need this vaccine if you have not already been vaccinated. Zoster (shingles) vaccine  You may need this after age 64. Measles, mumps, and rubella (MMR) vaccine  You may need at least one dose of MMR if you were born in 1957 or later. You may also need a second dose. Pneumococcal conjugate (PCV13) vaccine  You may need this if you have certain conditions and were not previously vaccinated. Pneumococcal polysaccharide (PPSV23) vaccine  You may need one or two doses if you smoke cigarettes or if you have certain conditions. Meningococcal conjugate (MenACWY) vaccine  You may need this if you have certain conditions. Hepatitis A  vaccine  You may need this if you have certain conditions or if you travel or work in places where you may be exposed to hepatitis A. Hepatitis B vaccine  You may need this if you have certain conditions or if you travel or work in places where you may be exposed to hepatitis B. Haemophilus influenzae type b (Hib) vaccine  You may need this if you have certain risk factors. Human papillomavirus (HPV) vaccine  If recommended by your health care provider, you may need three doses over 6 months. You may receive vaccines as individual doses or as more than one vaccine together in one shot (combination vaccines). Talk with your health care provider about the risks and benefits of combination vaccines. What tests do I need? Blood tests  Lipid and cholesterol levels. These may be checked every 5 years, or more frequently if you are over 60 years old.  Hepatitis C test.  Hepatitis B test. Screening  Lung cancer screening. You may have this screening every year starting at age 43 if you have a 30-pack-year history of smoking and currently smoke or have quit within the past 15 years.  Prostate cancer screening. Recommendations will vary depending on your family history and other risks.  Colorectal cancer screening. All adults should have this screening starting at age 72 and continuing until age 2. Your health care provider may recommend screening at age 14 if you are at increased risk. You will have tests every 1-10 years, depending on your results and the type of screening test.  Diabetes screening. This is done by checking your blood sugar (glucose) after you have not eaten  for a while (fasting). You may have this done every 1-3 years.  Sexually transmitted disease (STD) testing. Follow these instructions at home: Eating and drinking  Eat a diet that includes fresh fruits and vegetables, whole grains, lean protein, and low-fat dairy products.  Take vitamin and mineral supplements as  recommended by your health care provider.  Do not drink alcohol if your health care provider tells you not to drink.  If you drink alcohol: ? Limit how much you have to 0-2 drinks a day. ? Be aware of how much alcohol is in your drink. In the [place] one drink equals one 12 oz bottle of beer (355 mL), one 5 oz glass of wine (148 mL), or one 1 oz glass of hard liquor (44 mL). Lifestyle  Take daily care of your teeth and gums.  Stay active. Exercise for at least 30 minutes on 5 or more days each week.  Do not use any products that contain nicotine or tobacco, such as cigarettes, e-cigarettes, and chewing tobacco. If you need help quitting, ask your health care provider.  If you are sexually active, practice safe sex. Use a condom or other form of protection to prevent STIs (sexually transmitted infections).  Talk with your health care provider about taking a low-dose aspirin every day starting at age 61. What's next?  Go to your health care provider once a year for a well check visit.  Ask your health care provider how often you should have your eyes and teeth checked.  Stay up to date on all vaccines. This information is not intended to replace advice given to you by your health care provider. Make sure you discuss any questions you have with your health care provider. Document Revised: 07/28/2018 Document Reviewed: 07/28/2018 Elsevier Patient Education  2020 [employer].  Managing Your Hypertension Hypertension is commonly called high blood pressure. This is when the force of your blood pressing against the walls of your arteries is too strong. Arteries are blood vessels that carry blood from your heart throughout your body. Hypertension forces the heart to work harder to pump blood, and may cause the arteries to become narrow or stiff. Having untreated or uncontrolled hypertension can cause heart attack, stroke, kidney disease, and other problems. What are blood pressure readings? A  blood pressure reading consists of a higher number over a lower number. Ideally, your blood pressure should be below 120/80. The first ("top") number is called the systolic pressure. It is a measure of the pressure in your arteries as your heart beats. The second ("bottom") number is called the diastolic pressure. It is a measure of the pressure in your arteries as the heart relaxes. What does my blood pressure reading mean? Blood pressure is classified into four stages. Based on your blood pressure reading, your health care provider may use the following stages to determine what type of treatment you need, if any. Systolic pressure and diastolic pressure are measured in a unit called mm Hg. Normal  Systolic pressure: below 762.  Diastolic pressure: below 80. Elevated  Systolic pressure: 831-517.  Diastolic pressure: below 80. Hypertension stage 1  Systolic pressure: 616-073.  Diastolic pressure: 71-06. Hypertension stage 2  Systolic pressure: 269 or above.  Diastolic pressure: 90 or above. What health risks are associated with hypertension? Managing your hypertension is an important responsibility. Uncontrolled hypertension can lead to:  A heart attack.  A stroke.  A weakened blood vessel (aneurysm).  Heart failure.  Kidney damage.  Eye damage.  Metabolic syndrome.  Memory and concentration problems. What changes can I make to manage my hypertension? Hypertension can be managed by making lifestyle changes and possibly by taking medicines. Your health care provider will help you make a plan to bring your blood pressure within a normal range. Eating and drinking   Eat a diet that is high in fiber and potassium, and low in salt (sodium), added sugar, and fat. An example eating plan is called the DASH (Dietary Approaches to Stop Hypertension) diet. To eat this way: ? Eat plenty of fresh fruits and vegetables. Try to fill half of your plate at each meal with fruits and  vegetables. ? Eat whole grains, such as whole wheat pasta, brown rice, or whole grain bread. Fill about one quarter of your plate with whole grains. ? Eat low-fat diary products. ? Avoid fatty cuts of meat, processed or cured meats, and poultry with skin. Fill about one quarter of your plate with lean proteins such as fish, chicken without skin, beans, eggs, and tofu. ? Avoid premade and processed foods. These tend to be higher in sodium, added sugar, and fat.  Reduce your daily sodium intake. Most people with hypertension should eat less than 1,500 mg of sodium a day.  Limit alcohol intake to no more than 1 drink a day for nonpregnant women and 2 drinks a day for men. One drink equals 12 oz of beer, 5 oz of wine, or 1 oz of hard liquor. Lifestyle  Work with your health care provider to maintain a healthy body weight, or to lose weight. Ask what an ideal weight is for you.  Get at least 30 minutes of exercise that causes your heart to beat faster (aerobic exercise) most days of the week. Activities may include walking, swimming, or biking.  Include exercise to strengthen your muscles (resistance exercise), such as weight lifting, as part of your weekly exercise routine. Try to do these types of exercises for 30 minutes at least 3 days a week.  Do not use any products that contain nicotine or tobacco, such as cigarettes and e-cigarettes. If you need help quitting, ask your health care provider.  Control any long-term (chronic) conditions you have, such as high cholesterol or diabetes. Monitoring  Monitor your blood pressure at home as told by your health care provider. Your personal target blood pressure may vary depending on your medical conditions, your age, and other factors.  Have your blood pressure checked regularly, as often as told by your health care provider. Working with your health care provider  Review all the medicines you take with your health care provider because there may  be side effects or interactions.  Talk with your health care provider about your diet, exercise habits, and other lifestyle factors that may be contributing to hypertension.  Visit your health care provider regularly. Your health care provider can help you create and adjust your plan for managing hypertension. Will I need medicine to control my blood pressure? Your health care provider may prescribe medicine if lifestyle changes are not enough to get your blood pressure under control, and if:  Your systolic blood pressure is 130 or higher.  Your diastolic blood pressure is 80 or higher. Take medicines only as told by your health care provider. Follow the directions carefully. Blood pressure medicines must be taken as prescribed. The medicine does not work as well when you skip doses. Skipping doses also puts you at risk for problems. Contact a health care provider if:  You think you are having a reaction to medicines you have taken.  You have repeated (recurrent) headaches.  You feel dizzy.  You have swelling in your ankles.  You have trouble with your vision. Get help right away if:  You develop a severe headache or confusion.  You have unusual weakness or numbness, or you feel faint.  You have severe pain in your chest or abdomen.  You vomit repeatedly.  You have trouble breathing. Summary  Hypertension is when the force of blood pumping through your arteries is too strong. If this condition is not controlled, it may put you at risk for serious complications.  Your personal target blood pressure may vary depending on your medical conditions, your age, and other factors. For most people, a normal blood pressure is less than 120/80.  Hypertension is managed by lifestyle changes, medicines, or both. Lifestyle changes include weight loss, eating a healthy, low-sodium diet, exercising more, and limiting alcohol. This information is not intended to replace advice given to you by  your health care provider. Make sure you discuss any questions you have with your health care provider. Document Revised: 11/25/2018 Document Reviewed: 07/01/2016 Elsevier Patient Education  [place].  Diabetes Mellitus and Standards of Medical Care Managing diabetes (diabetes mellitus) can be complicated. Your diabetes treatment may be managed by a team of health care providers, including:  A physician who specializes in diabetes (endocrinologist).  A nurse practitioner or physician assistant.  Nurses.  A diet and nutrition specialist (registered dietitian).  A certified diabetes educator (CDE).  An exercise specialist.  A pharmacist.  An eye doctor.  A foot specialist (podiatrist).  A dentist.  A primary care provider.  A mental health provider. Your health care providers follow guidelines to help you get the best quality of care. The following schedule is a general guideline for your diabetes management plan. Your health care providers may give you more specific instructions. Physical exams Upon being diagnosed with diabetes mellitus, and each year after that, your health care provider will ask about your medical and family history. He or she will also do a physical exam. Your exam may include:  Measuring your height, weight, and body mass index (BMI).  Checking your blood pressure. This will be done at every routine medical visit. Your target blood pressure may vary depending on your medical conditions, your age, and other factors.  Thyroid gland exam.  Skin exam.  Screening for damage to your nerves (peripheral neuropathy). This may include checking the pulse in your legs and feet and checking the level of sensation in your hands and feet.  A complete foot exam to inspect the structure and skin of your feet, including checking for cuts, bruises, redness, blisters, sores, or other problems.  Screening for blood vessel (vascular) problems, which may include  checking the pulse in your legs and feet and checking your temperature. Blood tests Depending on your treatment plan and your personal needs, you may have the following tests done:  HbA1c (hemoglobin A1c). This test provides information about blood sugar (glucose) control over the previous 2-3 months. It is used to adjust your treatment plan, if needed. This test will be done: ? At least 2 times a year, if you are meeting your treatment goals. ? 4 times a year, if you are not meeting your treatment goals or if treatment goals have changed.  Lipid testing, including total, LDL, and HDL cholesterol and triglyceride levels. ? The goal for LDL is less  than 100 mg/dL (5.5 mmol/L). If you are at high risk for complications, the goal is less than 70 mg/dL (3.9 mmol/L). ? The goal for HDL is 40 mg/dL (2.2 mmol/L) or higher for men and 50 mg/dL (2.8 mmol/L) or higher for women. An HDL cholesterol of 60 mg/dL (3.3 mmol/L) or higher gives some protection against heart disease. ? The goal for triglycerides is less than 150 mg/dL (8.3 mmol/L).  Liver function tests.  Kidney function tests.  Thyroid function tests. Dental and eye exams  Visit your dentist two times a year.  If you have type 1 diabetes, your health care provider may recommend an eye exam 3-5 years after you are diagnosed, and then once a year after your first exam. ? For children with type 1 diabetes, a health care provider may recommend an eye exam when your child is age 8 or older and has had diabetes for 3-5 years. After the first exam, your child should get an eye exam once a year.  If you have type 2 diabetes, your health care provider may recommend an eye exam as soon as you are diagnosed, and then once a year after your first exam. Immunizations   The yearly flu (influenza) vaccine is recommended for everyone 6 months or older who has diabetes.  The pneumonia (pneumococcal) vaccine is recommended for everyone 2 years or older  who has diabetes. If you are 8 or older, you may get the pneumonia vaccine as a series of two separate shots.  The hepatitis B vaccine is recommended for adults shortly after being diagnosed with diabetes.  Adults and children with diabetes should receive all other vaccines according to age-specific recommendations from the Centers for Disease Control and Prevention (CDC). Mental and emotional health Screening for symptoms of eating disorders, anxiety, and depression is recommended at the time of diagnosis and afterward as needed. If your screening shows that you have symptoms (positive screening result), you may need more evaluation and you may work with a mental health care provider. Treatment plan Your treatment plan will be reviewed at every medical visit. You and your health care provider will discuss:  How you are taking your medicines, including insulin.  Any side effects you are experiencing.  Your blood glucose target goals.  The frequency of your blood glucose monitoring.  Lifestyle habits, such as activity level as well as tobacco, alcohol, and substance use. Diabetes self-management education Your health care provider will assess how well you are monitoring your blood glucose levels and whether you are taking your insulin correctly. He or she may refer you to:  A certified diabetes educator to manage your diabetes throughout your life, starting at diagnosis.  A registered dietitian who can create or review your personal nutrition plan.  An exercise specialist who can discuss your activity level and exercise plan. Summary  Managing diabetes (diabetes mellitus) can be complicated. Your diabetes treatment may be managed by a team of health care providers.  Your health care providers follow guidelines in order to help you get the best quality of care.  Standards of care including having regular physical exams, blood tests, blood pressure monitoring, immunizations, screening  tests, and education about how to manage your diabetes.  Your health care providers may also give you more specific instructions based on your individual health. This information is not intended to replace advice given to you by your health care provider. Make sure you discuss any questions you have with your health care provider. Document  Revised: 04/22/2018 Document Reviewed: 05/01/2016 Elsevier Patient Education  [place].

## 2020-05-01 NOTE — Telephone Encounter (Signed)
Pt stated he found a pic of his COVID card. He received Moderna on 10/15/19 and 11/13/19

## 2020-05-01 NOTE — Telephone Encounter (Signed)
Pt  COVID Vaccine have been update in his chart

## 2020-05-01 NOTE — Progress Notes (Signed)
Subjective:     Erik Rojas is a 49 y.o. male and is here for a comprehensive physical exam. The patient reports no problems.  Patient states he has been doing well overall.  Endorses doing some traveling to [place] for work.  Patient notes blood sugars have been stable.  Using freestyle [place] system.  Pt had 2 doses of COVID vaccine.  Social History   Socioeconomic History  . Marital status: Single    Spouse name: Not on file  . Number of children: Not on file  . Years of education: Not on file  . Highest education level: Not on file  Occupational History  . Not on file  Tobacco Use  . Smoking status: Never Smoker  . Smokeless tobacco: Never Used  Vaping Use  . Vaping Use: Never used  Substance and Sexual Activity  . Alcohol use: Yes    Comment: 01/28/2018 "2 drinks/month; wine or whiskey"  . Drug use: Never  . Sexual activity: Not Currently  Other Topics Concern  . Not on file  Social History Narrative  . Not on file   Social Determinants of Health   Financial Resource Strain:   . Difficulty of Paying Living Expenses: Not on file  Food Insecurity:   . Worried About Charity fundraiser in the Last Year: Not on file  . Ran Out of Food in the Last Year: Not on file  Transportation Needs:   . Lack of Transportation (Medical): Not on file  . Lack of Transportation (Non-Medical): Not on file  Physical Activity:   . Days of Exercise per Week: Not on file  . Minutes of Exercise per Session: Not on file  Stress:   . Feeling of Stress : Not on file  Social Connections:   . Frequency of Communication with Friends and Family: Not on file  . Frequency of Social Gatherings with Friends and Family: Not on file  . Attends Religious Services: Not on file  . Active Member of Clubs or Organizations: Not on file  . Attends Archivist Meetings: Not on file  . Marital Status: Not on file  Intimate Partner Violence:   . Fear of Current or Ex-Partner: Not  on file  . Emotionally Abused: Not on file  . Physically Abused: Not on file  . Sexually Abused: Not on file   Health Maintenance  Topic Date Due  . Hepatitis C Screening  Never done  . PNEUMOCOCCAL POLYSACCHARIDE VACCINE AGE 72-64 HIGH RISK  Never done  . FOOT EXAM  Never done  . OPHTHALMOLOGY EXAM  Never done  . COVID-19 Vaccine (1) Never done  . TETANUS/TDAP  Never done  . HEMOGLOBIN A1C  11/07/2018  . INFLUENZA VACCINE  Never done  . HIV Screening  Completed    The following portions of the patient's history were reviewed and updated as appropriate: allergies, current medications, past family history, past medical history, past social history, past surgical history and problem list.  Review of Systems Pertinent items noted in HPI and remainder of comprehensive ROS otherwise negative.   Objective:    BP (!) 142/90 (BP Location: Left Arm, Patient Position: Sitting, Cuff Size: Large)   Pulse (!) 102   Temp 97.9 F (36.6 C) (Oral)   Ht 5' 5"  (1.651 m)   Wt 243 lb (110.2 kg)   SpO2 98%   BMI 40.44 kg/m  General appearance: alert, cooperative and no distress Head: Normocephalic, without obvious abnormality, atraumatic Eyes: conjunctivae/corneas clear.  PERRL, EOM's intact. Fundi benign. Ears: normal TM's and external ear canals both ears Nose: Nares normal. Septum midline. Mucosa normal. No drainage or sinus tenderness. Throat: lips, mucosa, and tongue normal; teeth and gums normal Neck: no adenopathy, no carotid bruit, no JVD, supple, symmetrical, trachea midline and thyroid not enlarged, symmetric, no tenderness/mass/nodules Lungs: clear to auscultation bilaterally Heart: regular rate and rhythm, S1, S2 normal, no murmur, click, rub or gallop Abdomen: soft, non-tender; bowel sounds normal; no masses,  no organomegaly Extremities: extremities normal, atraumatic, no cyanosis or edema Pulses: 2+ and symmetric Skin: Skin color, texture, turgor normal. No rashes or  lesions Lymph nodes: Cervical, supraclavicular, and axillary nodes normal. Neurologic: Alert and oriented X 3, normal strength and tone. Normal symmetric reflexes. Normal coordination and gait   Diabetic Foot Exam - Simple   Simple Foot Form Diabetic Foot exam was performed with the following findings: Yes 05/01/2020 12:34 PM  Visual Inspection See comments: Yes Sensation Testing Intact to touch and monofilament testing bilaterally: Yes Pulse Check Posterior Tibialis and Dorsalis pulse intact bilaterally: Yes Comments Mildly thickened toenails, calluses on b/l plantar surface of b/l feet.       Assessment:    Healthy male exam.      Plan:     Anticipatory guidance given including wearing seatbelts, smoke detectors in the home, increasing physical activity, increasing p.o. intake of water and vegetables. -obtain labs -given handout -Next CPE 1 year See After Visit Summary for Counseling Recommendations    Controlled type 2 diabetes mellitus without complication, with long-term current use of insulin ([place])  -Foot exam done this visit -Continue current medications Metformin 1000 mg twice daily, Lantus 25 units nightly, Humalog KwikPen 40 units 3 times daily with meals. -Patient to schedule diabetic retinopathy screen - Plan: Hemoglobin A1c, Microalbumin/Creatinine Ratio, Urine, Lipid panel, Lipid panel, Microalbumin/Creatinine Ratio, Urine, Hemoglobin A1c  Essential hypertension  -elevated this visit -continue lifestyle modifications -continue norvasc 5 mg and lisinopril 10 mg daily - Plan: CMP with eGFR([facility]), CMP with eGFR([facility])  F/u prn in 4-6 months  Grier Mitts, MD

## 2020-05-02 LAB — MICROALBUMIN / CREATININE URINE RATIO
Creatinine, Urine: 106 mg/dL (ref 20–320)
Microalb Creat Ratio: 27 mcg/mg creat (ref <30)
Microalb, Ur: 2.9 mg/dL

## 2020-05-02 LAB — HEMOGLOBIN A1C
Hgb A1c MFr Bld: 5.6 % of total Hgb (ref <5.7)
Mean Plasma Glucose: 114 (calc)
eAG (mmol/L): 6.3 (calc)

## 2020-05-02 LAB — COMPLETE METABOLIC PANEL WITH GFR
AG Ratio: 1.6 (calc) (ref 1.0–2.5)
ALT: 13 U/L (ref 9–46)
AST: 14 U/L (ref 10–40)
Albumin: 4.5 g/dL (ref 3.6–5.1)
Alkaline phosphatase (APISO): 48 U/L (ref 36–130)
BUN: 19 mg/dL (ref 7–25)
CO2: 24 mmol/L (ref 20–32)
Calcium: 9.2 mg/dL (ref 8.6–10.3)
Chloride: 104 mmol/L (ref 98–110)
Creat: 0.96 mg/dL (ref 0.60–1.35)
GFR, Est African American: 109 mL/min/{1.73_m2} (ref >=60)
GFR, Est Non African American: 94 mL/min/{1.73_m2} (ref >=60)
Globulin: 2.8 g/dL (calc) (ref 1.9–3.7)
Glucose, Bld: 90 mg/dL (ref 65–99)
Potassium: 4.5 mmol/L (ref 3.5–5.3)
Sodium: 138 mmol/L (ref 135–146)
Total Bilirubin: 0.6 mg/dL (ref 0.2–1.2)
Total Protein: 7.3 g/dL (ref 6.1–8.1)

## 2020-05-02 LAB — CBC WITH DIFFERENTIAL/PLATELET
Absolute Monocytes: 666 cells/uL (ref 200–950)
Basophils Absolute: 78 cells/uL (ref 0–200)
Basophils Relative: 0.8 %
Eosinophils Absolute: 225 cells/uL (ref 15–500)
Eosinophils Relative: 2.3 %
HCT: 50.5 % — ABNORMAL HIGH (ref 38.5–50.0)
Hemoglobin: 17.2 g/dL — ABNORMAL HIGH (ref 13.2–17.1)
Lymphs Abs: 1989 cells/uL (ref 850–3900)
MCH: 30.7 pg (ref 27.0–33.0)
MCHC: 34.1 g/dL (ref 32.0–36.0)
MCV: 90 fL (ref 80.0–100.0)
MPV: 10 fL (ref 7.5–12.5)
Monocytes Relative: 6.8 %
Neutro Abs: 6840 cells/uL (ref 1500–7800)
Neutrophils Relative %: 69.8 %
Platelets: 331 10*3/uL (ref 140–400)
RBC: 5.61 10*6/uL (ref 4.20–5.80)
RDW: 13.2 % (ref 11.0–15.0)
Total Lymphocyte: 20.3 %
WBC: 9.8 10*3/uL (ref 3.8–10.8)

## 2020-05-24 ENCOUNTER — Other Ambulatory Visit: Payer: Self-pay | Admitting: Family Medicine

## 2020-05-26 ENCOUNTER — Other Ambulatory Visit: Payer: Self-pay | Admitting: Family Medicine

## 2020-06-02 ENCOUNTER — Other Ambulatory Visit: Payer: Self-pay | Admitting: Family Medicine

## 2020-06-28 ENCOUNTER — Other Ambulatory Visit: Payer: Self-pay | Admitting: Family Medicine

## 2020-07-29 ENCOUNTER — Other Ambulatory Visit: Payer: Self-pay | Admitting: Family Medicine

## 2020-09-30 ENCOUNTER — Other Ambulatory Visit: Payer: Self-pay | Admitting: Family Medicine

## 2020-10-30 ENCOUNTER — Other Ambulatory Visit: Payer: Self-pay | Admitting: Family Medicine

## 2020-11-10 ENCOUNTER — Other Ambulatory Visit: Payer: Self-pay | Admitting: Family Medicine

## 2020-11-23 ENCOUNTER — Other Ambulatory Visit: Payer: Self-pay | Admitting: Family Medicine

## 2021-01-02 ENCOUNTER — Other Ambulatory Visit: Payer: Self-pay | Admitting: Family Medicine

## 2021-03-03 ENCOUNTER — Other Ambulatory Visit: Payer: Self-pay | Admitting: Family Medicine

## 2021-03-27 ENCOUNTER — Other Ambulatory Visit: Payer: Self-pay | Admitting: Family Medicine

## 2021-04-20 ENCOUNTER — Other Ambulatory Visit: Payer: Self-pay | Admitting: Family Medicine

## 2021-04-22 ENCOUNTER — Telehealth: Payer: Self-pay

## 2021-04-22 DIAGNOSIS — E119 Type 2 diabetes mellitus without complications: Secondary | ICD-10-CM

## 2021-04-22 DIAGNOSIS — Z794 Long term (current) use of insulin: Secondary | ICD-10-CM

## 2021-04-22 NOTE — Telephone Encounter (Signed)
Patient called requesting new Rx for Continuous Blood Gluc Receiver (FREESTYLE LIBRE 14 DAY READER) DEVI Continuous Blood Gluc Sensor (FREESTYLE LIBRE 14 DAY SENSOR) MISC

## 2021-04-23 ENCOUNTER — Other Ambulatory Visit: Payer: Self-pay | Admitting: Family Medicine

## 2021-04-24 MED ORDER — FREESTYLE LIBRE 14 DAY READER DEVI: 1.0000 | Freq: Every day | 0 refills | Status: DC

## 2021-04-24 MED ORDER — FREESTYLE LIBRE 14 DAY SENSOR MISC: 11 refills | Status: AC

## 2021-04-24 NOTE — Addendum Note (Signed)
Addended by: Anderson Malta on: 04/24/2021 08:31 AM   Modules accepted: Orders

## 2021-04-24 NOTE — Telephone Encounter (Signed)
Refills sent

## 2021-05-08 ENCOUNTER — Other Ambulatory Visit: Payer: Self-pay

## 2021-05-08 ENCOUNTER — Telehealth: Payer: Self-pay | Admitting: Family Medicine

## 2021-05-08 NOTE — Telephone Encounter (Signed)
A user error has taken place: encounter opened in error, closed for administrative reasons.

## 2021-05-08 NOTE — Telephone Encounter (Signed)
PT called to request a refill of their Continuous Blood Gluc Sensor (FREESTYLE LIBRE 14 DAY SENSOR) MISC called into the [facility] pharmacy. Please advise.

## 2021-05-08 NOTE — Telephone Encounter (Signed)
Refills for this was sent in on 9/08. Called pt left message informing him refill should be at pharmacy.

## 2021-05-09 ENCOUNTER — Telehealth: Payer: Self-pay | Admitting: Family Medicine

## 2021-05-09 NOTE — Telephone Encounter (Signed)
Refill was sent on 9/08, pharmacy stated the did not receive it, spoke with pharmacist and refilled it by phone. Called pt, informed him it has been refilled and they should be letting him know when it will be ready for pickup.

## 2021-05-09 NOTE — Telephone Encounter (Signed)
Patient called in last night on after hours nurse line--he states there are issues with his prescription per the pharmacy and he would like to know what is going on.

## 2021-06-19 ENCOUNTER — Other Ambulatory Visit: Payer: Self-pay | Admitting: Family Medicine

## 2021-07-06 ENCOUNTER — Other Ambulatory Visit: Payer: Self-pay | Admitting: Family Medicine

## 2021-07-29 ENCOUNTER — Other Ambulatory Visit: Payer: Self-pay | Admitting: Family Medicine

## 2021-07-30 ENCOUNTER — Other Ambulatory Visit: Payer: Self-pay | Admitting: Family Medicine

## 2021-08-13 ENCOUNTER — Other Ambulatory Visit: Payer: Self-pay | Admitting: Family Medicine

## 2021-08-14 ENCOUNTER — Other Ambulatory Visit: Payer: Self-pay | Admitting: Family Medicine

## 2021-09-10 ENCOUNTER — Other Ambulatory Visit: Payer: Self-pay | Admitting: Family Medicine

## 2021-10-09 ENCOUNTER — Other Ambulatory Visit: Payer: Self-pay | Admitting: Family Medicine

## 2021-10-13 ENCOUNTER — Other Ambulatory Visit: Payer: Self-pay | Admitting: Family Medicine

## 2021-10-25 ENCOUNTER — Other Ambulatory Visit: Payer: Self-pay | Admitting: Family Medicine

## 2021-10-26 ENCOUNTER — Other Ambulatory Visit: Payer: Self-pay | Admitting: Family Medicine

## 2021-11-22 ENCOUNTER — Other Ambulatory Visit: Payer: Self-pay | Admitting: Family Medicine

## 2021-12-13 ENCOUNTER — Other Ambulatory Visit: Payer: Self-pay | Admitting: Family Medicine

## 2021-12-15 NOTE — Telephone Encounter (Signed)
Last CPE Sept 2021. Pt notified that appt will be required for refills. [place] states [place] & will need an appt at the end of May. Appt scheduled. ?

## 2022-01-14 ENCOUNTER — Other Ambulatory Visit: Payer: Self-pay | Admitting: Family Medicine

## 2022-01-14 ENCOUNTER — Encounter: Payer: Self-pay | Admitting: Family Medicine

## 2022-01-14 ENCOUNTER — Ambulatory Visit (INDEPENDENT_AMBULATORY_CARE_PROVIDER_SITE_OTHER): Payer: 59 | Admitting: Family Medicine

## 2022-01-14 VITALS — BP 122/82 | HR 97 | Temp 98.7°F | Ht 67.75 in | Wt 244.2 lb

## 2022-01-14 DIAGNOSIS — Z125 Encounter for screening for malignant neoplasm of prostate: Secondary | ICD-10-CM | POA: Diagnosis not present

## 2022-01-14 DIAGNOSIS — E119 Type 2 diabetes mellitus without complications: Secondary | ICD-10-CM

## 2022-01-14 DIAGNOSIS — Z0000 Encounter for general adult medical examination without abnormal findings: Secondary | ICD-10-CM | POA: Diagnosis not present

## 2022-01-14 DIAGNOSIS — I10 Essential (primary) hypertension: Secondary | ICD-10-CM | POA: Diagnosis not present

## 2022-01-14 DIAGNOSIS — Z794 Long term (current) use of insulin: Secondary | ICD-10-CM

## 2022-01-14 DIAGNOSIS — Z1211 Encounter for screening for malignant neoplasm of colon: Secondary | ICD-10-CM | POA: Diagnosis not present

## 2022-01-14 MED ORDER — LISINOPRIL 10 MG PO TABS
ORAL_TABLET | ORAL | 3 refills | Status: DC
Start: 2022-01-14 — End: 2023-01-07

## 2022-01-14 MED ORDER — FREESTYLE LIBRE 14 DAY READER DEVI: 1.0000 | Freq: Every day | 11 refills | Status: DC

## 2022-01-14 MED ORDER — LANTUS SOLOSTAR 100 UNIT/ML ~~LOC~~ SOPN
PEN_INJECTOR | SUBCUTANEOUS | 6 refills | Status: DC
Start: 2022-01-14 — End: 2022-07-29

## 2022-01-14 MED ORDER — AMLODIPINE BESYLATE 5 MG PO TABS: ORAL_TABLET | ORAL | 3 refills | Status: DC

## 2022-01-14 MED ORDER — INSULIN LISPRO (1 UNIT DIAL) 100 UNIT/ML (KWIKPEN)
PEN_INJECTOR | SUBCUTANEOUS | 6 refills | Status: DC
Start: 2022-01-14 — End: 2022-01-14

## 2022-01-14 MED ORDER — METFORMIN HCL 1000 MG PO TABS: ORAL_TABLET | ORAL | 3 refills | Status: DC

## 2022-01-14 NOTE — Progress Notes (Signed)
Subjective:     Erik Rojas is a 51 y.o. male and is here for a comprehensive physical exam. The patient reports doing well overall.  Pt staying busy travelling for work.  Has been to [place] and [place].  BS well controlled.  Typically from 80-150.  Notes the 150 is if he eats something he shouldn't.  Pt's continuous glucometer states A1c is around 5.5%.  Patient states blood pressure has been controlled.  Patient has not had colonoscopy.  Had eye exam March 2022.  Patient up-to-date on all COVID vaccines.  Social History   Socioeconomic History   Marital status: Single    Spouse name: Not on file   Number of children: Not on file   Years of education: Not on file   Highest education level: Not on file  Occupational History   Not on file  Tobacco Use   Smoking status: Never   Smokeless tobacco: Never  Vaping Use   Vaping Use: Never used  Substance and Sexual Activity   Alcohol use: Yes    Comment: 01/28/2018 "2 drinks/month; wine or whiskey"   Drug use: Never   Sexual activity: Not Currently  Other Topics Concern   Not on file  Social History Narrative   Not on file   Social Determinants of Health   Financial Resource Strain: Not on file  Food Insecurity: Not on file  Transportation Needs: Not on file  Physical Activity: Not on file  Stress: Not on file  Social Connections: Not on file  Intimate Partner Violence: Not on file   Health Maintenance  Topic Date Due   COLONOSCOPY (Pts 45-55yr Insurance coverage will need to be confirmed)  Never done   HEMOGLOBIN A1C  10/29/2020   FOOT EXAM  05/01/2021   TETANUS/TDAP  01/15/2023 (Originally 06/14/1990)   Hepatitis C Screening  01/15/2023 (Originally 06/15/1989)   INFLUENZA VACCINE  03/17/2022   OPHTHALMOLOGY EXAM  11/15/2022   COVID-19 Vaccine  Completed   HIV Screening  Completed   HPV VACCINES  Aged Out    The following portions of the patient's history were reviewed and updated as appropriate:  All.  Review of Systems Pertinent items noted in HPI and remainder of comprehensive ROS otherwise negative.   Objective:    BP 122/82 (BP Location: Left Arm, Patient Position: Sitting, Cuff Size: Large)   Pulse 97   Temp 98.7 F (37.1 C) (Oral)   Ht 5' 7.75" (1.721 m)   Wt 244 lb 3.2 oz (110.8 kg)   SpO2 98%   BMI 37.41 kg/m  General appearance: alert, cooperative, and no distress Head: Normocephalic, without obvious abnormality, atraumatic Eyes: conjunctivae/corneas clear. PERRL, EOM's intact. Fundi benign. Ears: normal TM's and external ear canals both ears Nose: Nares normal. Septum midline. Mucosa normal. No drainage or sinus tenderness. Throat: lips, mucosa, and tongue normal; teeth and gums normal Neck: no adenopathy, no carotid bruit, no JVD, supple, symmetrical, trachea midline, and thyroid not enlarged, symmetric, no tenderness/mass/nodules Lungs: clear to auscultation bilaterally Heart: regular rate and rhythm, S1, S2 normal, no murmur, click, rub or gallop Abdomen: soft, non-tender; bowel sounds normal; no masses,  no organomegaly Extremities: extremities normal, atraumatic, no cyanosis or edema Pulses: 2+ and symmetric Skin: Skin color, texture, turgor normal. No rashes or lesions Lymph nodes: Cervical, supraclavicular, and axillary nodes normal. Neurologic: Alert and oriented X 3, normal strength and tone. Normal symmetric reflexes. Normal coordination and gait  Diabetic Foot Exam - Simple   Simple Foot  Form Diabetic Foot exam was performed with the following findings: Yes 01/14/2022 10:06 AM  Visual Inspection No deformities, no ulcerations, no other skin breakdown bilaterally: Yes See comments: Yes Sensation Testing Intact to touch and monofilament testing bilaterally: Yes Pulse Check Posterior Tibialis and Dorsalis pulse intact bilaterally: Yes Comments Mildly thickened toe nails.        Assessment:    Healthy male exam.      Plan:    Anticipatory  guidance given including wearing seatbelts, smoke detectors in the home, increasing physical activity, increasing p.o. intake of water and vegetables. -obtain labs -will do cologuard for colon cancer screen 2/2 work schedule.  No family hx of colon cancer -immunizations reviewed -given handout -next CPE in 1 yr See After Visit Summary for Counseling Recommendations   Essential hypertension  -well controlled -continue current meds -Continue lifestyle modifications - Plan: CBC with Differential/Platelet, TSH, T4, Free, CMP, lisinopril (ZESTRIL) 10 MG tablet, amLODipine (NORVASC) 5 MG tablet  Type 2 diabetes mellitus without complication, with long-term current use of insulin (HCC) -Well-controlled -Continue current medications -Continue lifestyle modifications -Foot exam done this visit -Diabetic retinopathy screening done 10/2018 -Continue ACE-I  - Plan: CBC with Differential/Platelet, Hemoglobin A1c, Lipid panel, Microalbumin/Creatinine Ratio, Urine, metFORMIN (GLUCOPHAGE) 1000 MG tablet, insulin glargine (LANTUS SOLOSTAR) 100 UNIT/ML Solostar Pen, insulin lispro (HUMALOG KWIKPEN) 100 UNIT/ML KwikPen, Continuous Blood Gluc Receiver (FREESTYLE LIBRE 14 DAY READER) DEVI  Colon cancer screening - Plan: Cologuard  Prostate cancer screening - Plan: PSA  F/u in 6 months to 1 yr  Grier Mitts, MD

## 2022-07-24 ENCOUNTER — Other Ambulatory Visit: Payer: Self-pay | Admitting: Family Medicine

## 2022-07-24 DIAGNOSIS — E119 Type 2 diabetes mellitus without complications: Secondary | ICD-10-CM

## 2022-07-28 NOTE — Telephone Encounter (Signed)
Contact patient. Left a detail message for patient that he is due for his 6 months f/u on Diabetes and to call us back.

## 2023-01-07 ENCOUNTER — Other Ambulatory Visit: Payer: Self-pay | Admitting: Family Medicine

## 2023-01-07 DIAGNOSIS — I10 Essential (primary) hypertension: Secondary | ICD-10-CM

## 2023-01-07 DIAGNOSIS — E119 Type 2 diabetes mellitus without complications: Secondary | ICD-10-CM

## 2023-02-21 ENCOUNTER — Other Ambulatory Visit: Payer: Self-pay | Admitting: Family Medicine

## 2023-02-21 DIAGNOSIS — E119 Type 2 diabetes mellitus without complications: Secondary | ICD-10-CM

## 2023-06-30 ENCOUNTER — Other Ambulatory Visit: Payer: Self-pay | Admitting: Family Medicine

## 2023-06-30 DIAGNOSIS — Z794 Long term (current) use of insulin: Secondary | ICD-10-CM

## 2023-06-30 DIAGNOSIS — I10 Essential (primary) hypertension: Secondary | ICD-10-CM

## 2023-06-30 DIAGNOSIS — E119 Type 2 diabetes mellitus without complications: Secondary | ICD-10-CM

## 2023-07-27 ENCOUNTER — Other Ambulatory Visit: Payer: Self-pay | Admitting: Family Medicine

## 2023-07-27 DIAGNOSIS — I10 Essential (primary) hypertension: Secondary | ICD-10-CM

## 2023-07-31 ENCOUNTER — Other Ambulatory Visit: Payer: Self-pay | Admitting: Family Medicine

## 2023-07-31 DIAGNOSIS — I10 Essential (primary) hypertension: Secondary | ICD-10-CM

## 2023-08-29 ENCOUNTER — Other Ambulatory Visit: Payer: Self-pay | Admitting: Family Medicine

## 2023-08-29 DIAGNOSIS — I10 Essential (primary) hypertension: Secondary | ICD-10-CM

## 2023-09-29 ENCOUNTER — Other Ambulatory Visit: Payer: Self-pay | Admitting: Family Medicine

## 2023-09-29 DIAGNOSIS — E119 Type 2 diabetes mellitus without complications: Secondary | ICD-10-CM

## 2023-12-07 ENCOUNTER — Other Ambulatory Visit: Payer: Self-pay | Admitting: Family Medicine

## 2023-12-07 DIAGNOSIS — I10 Essential (primary) hypertension: Secondary | ICD-10-CM
